# Patient Record
Sex: Male | Born: 1950 | Race: White | Hispanic: No | Marital: Married | Smoking: Never smoker
Health system: Southern US, Community
[De-identification: ages and names within clinical notes are randomized; demographics above are authoritative.]

## PROBLEM LIST (undated history)

## (undated) DIAGNOSIS — I10 Essential (primary) hypertension: Secondary | ICD-10-CM

---

## 2006-06-22 ENCOUNTER — Ambulatory Visit: Payer: Self-pay | Admitting: Unknown Physician Specialty

## 2007-03-01 ENCOUNTER — Ambulatory Visit: Payer: Self-pay | Admitting: Unknown Physician Specialty

## 2008-01-28 IMAGING — NM NUCLEAR MEDICINE HEPATOHBILIARY INCLUDE GB
1 series · 9 of 9 positions shown · non-contrast
Comparison: none

REASON FOR EXAM: RUQ pain
COMMENTS:

[Series 1000: gallbladder statics · 4.80mm/px · 9 of 9 slices shown]
[im 1/9]
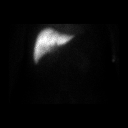
[im 2/9]
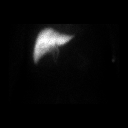
[im 3/9]
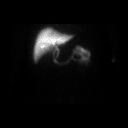
[im 4/9]
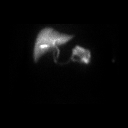
[im 5/9]
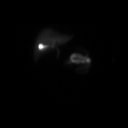
[im 6/9]
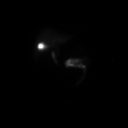
[im 7/9]
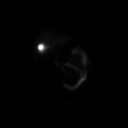
[im 8/9]
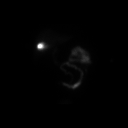
[im 9/9]
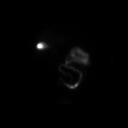

[9 of 9 positions shown; findings below may reference images not displayed]

PROCEDURE:     NM  - NM HEPATOBILIARY IMAGE  - June 22, 2006 [DATE]

RESULT:     Following intravenous administration 7.63 mCi of Technetium 99m
Choletec, there is observed prompt visualization of tracer activity in the
liver at 3 minutes. At 20 minutes, tracer activity is visualized in the
gallbladder, common duct and proximal small bowel.
IMPRESSION: 1.  Normal Hepatobiliary Scan.
2.  An ejection fraction determination was not performed in view of the
gallstones observed at ultrasound.

## 2008-01-28 IMAGING — US ABDOMEN ULTRASOUND
1 series · 17 of 25 positions shown · non-contrast
Comparison: none

REASON FOR EXAM: RUQ pain
COMMENTS:

[Series 1: abdomen ultrasound · 17 of 78 slices shown]
[im 1/78]
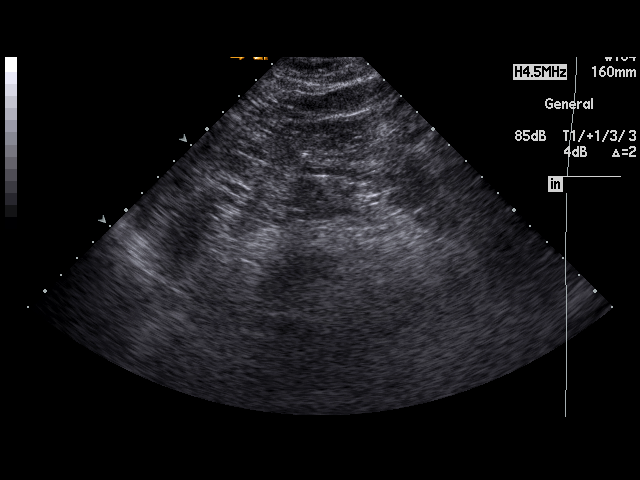
[im 7/78]
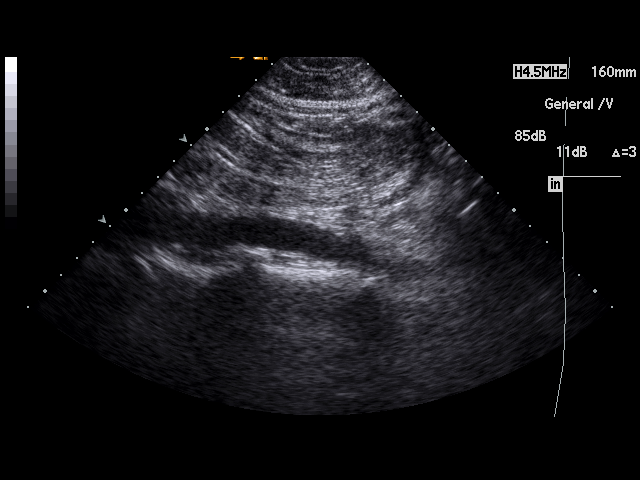
[im 10/78]
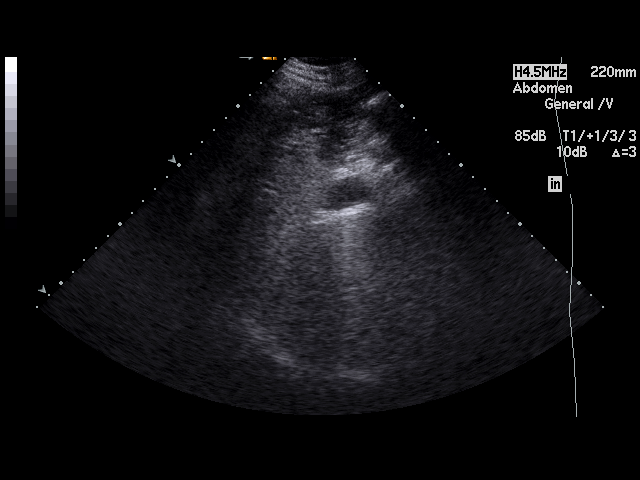
[im 17/78]
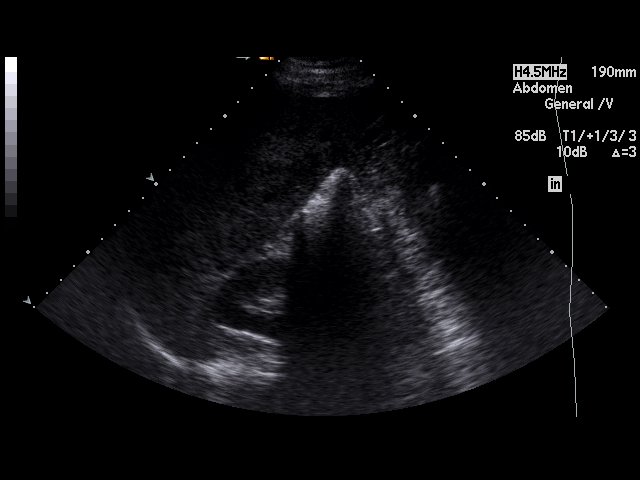
[im 20/78]
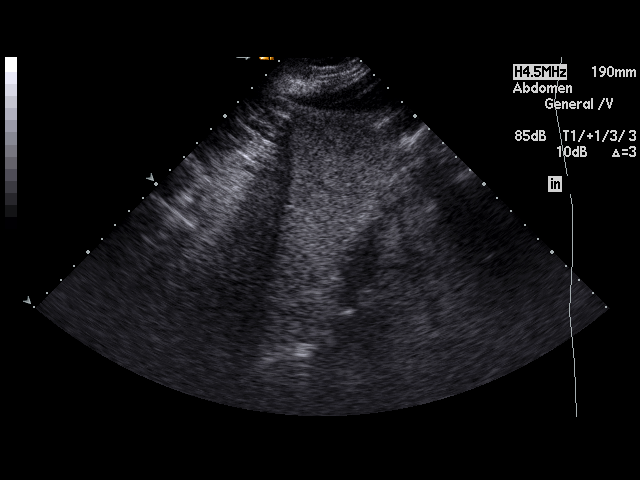
[im 26/78]
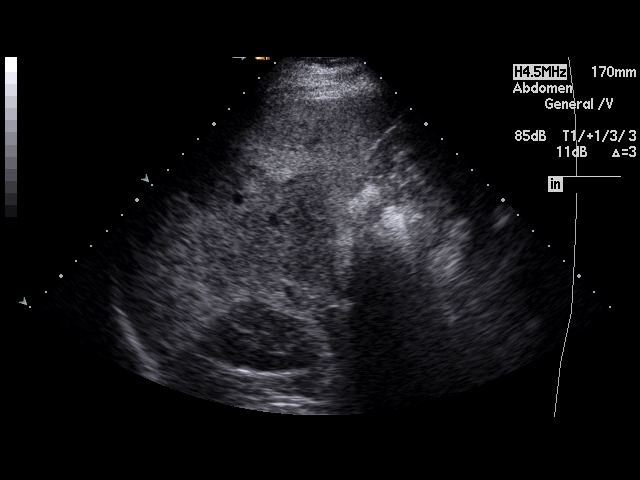
[im 29/78]
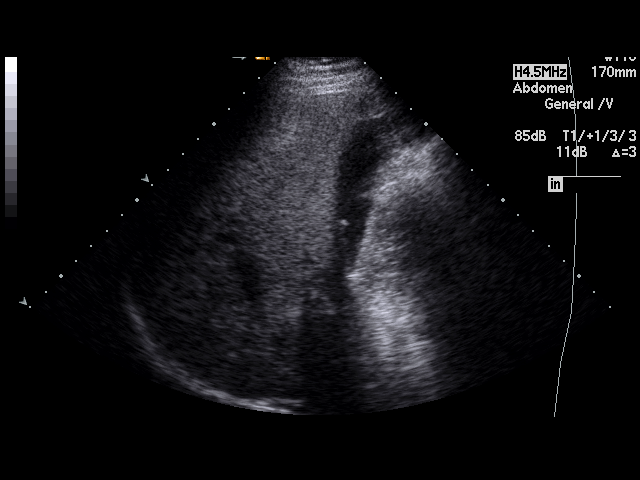
[im 36/78]
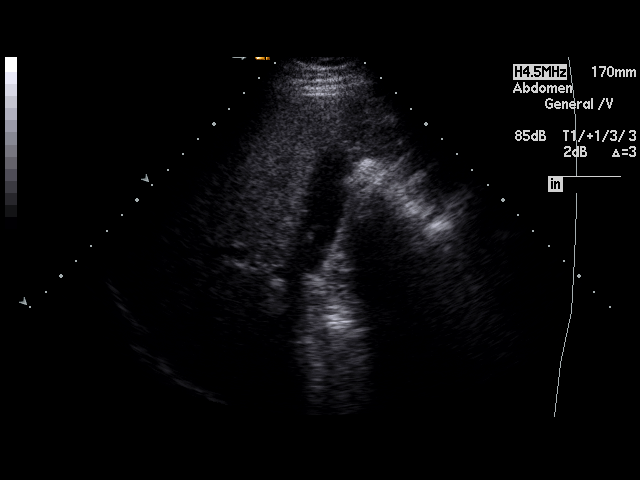
[im 39/78]
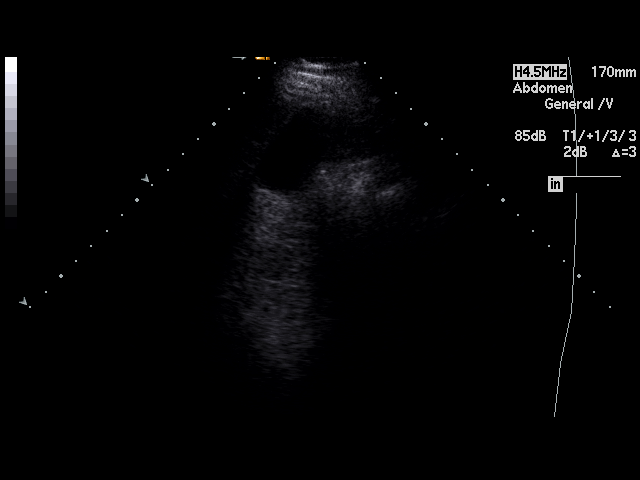
[im 42/78]
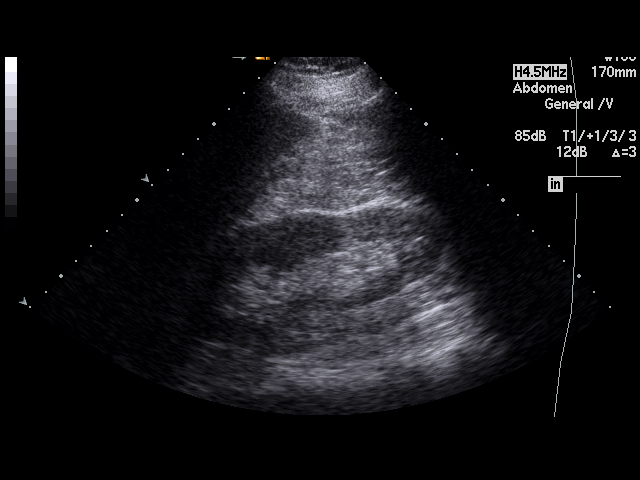
[im 49/78]
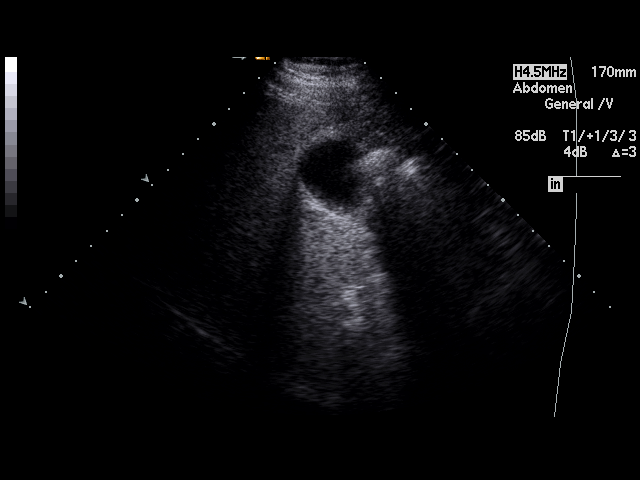
[im 52/78]
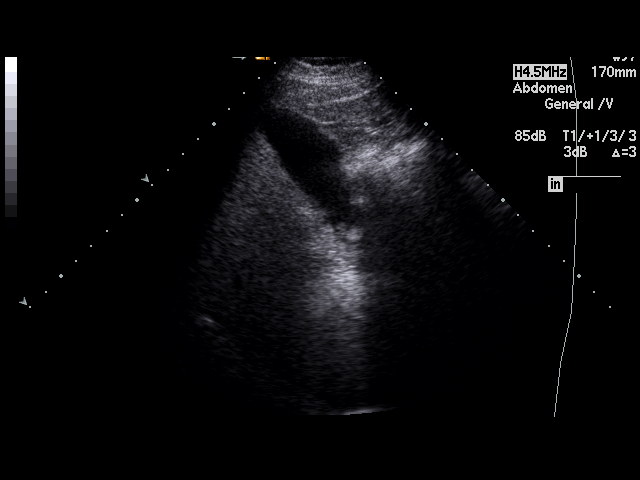
[im 58/78]
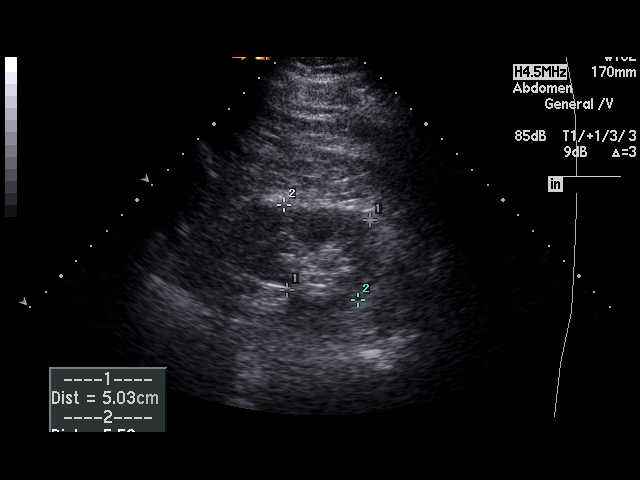
[im 61/78]
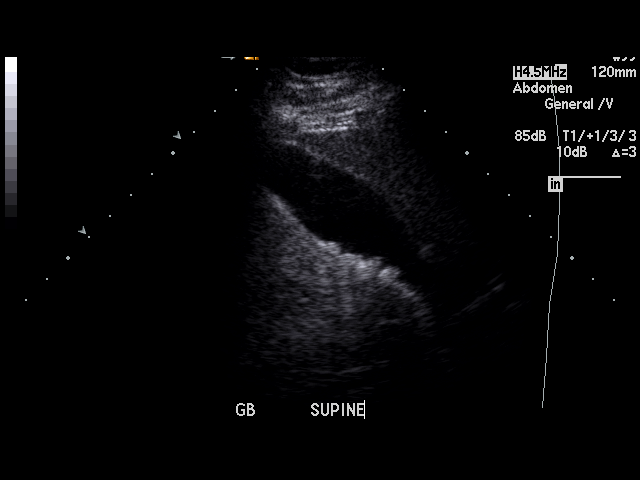
[im 68/78]
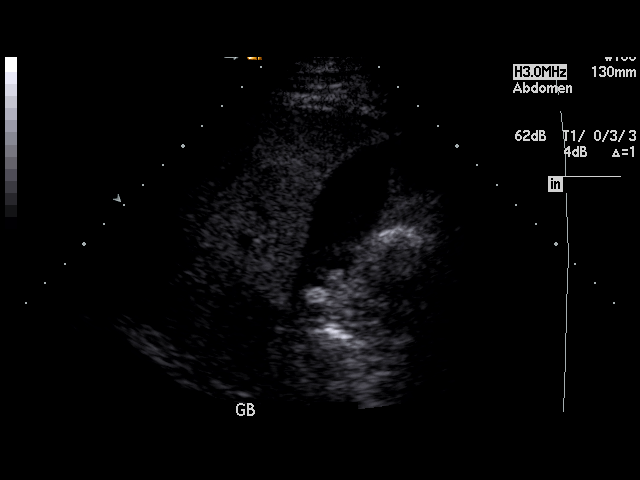
[im 71/78]
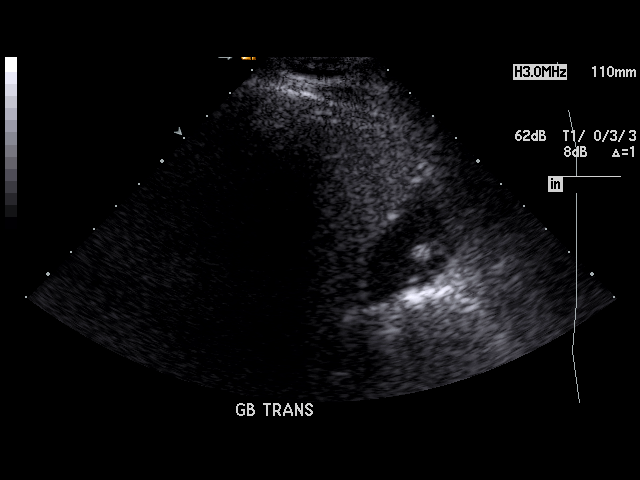
[im 78/78]
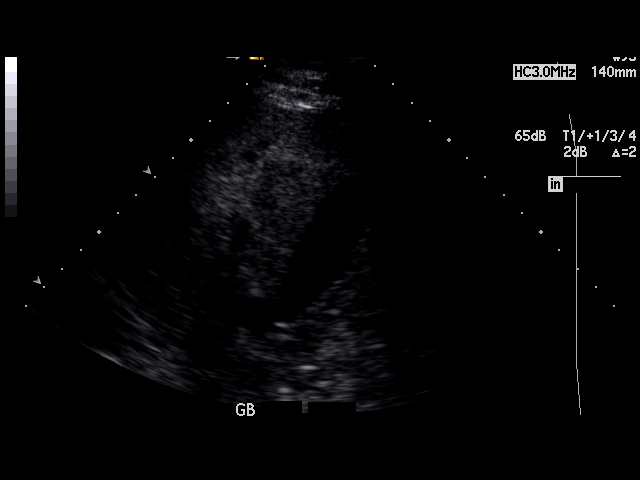

[17 of 25 positions shown; findings below may reference images not displayed]

PROCEDURE:     US  - US ABDOMEN GENERAL SURVEY  - June 22, 2006  [DATE]

RESULT:     The liver is not nonhomogeneous, suspicious for fatty
infiltration. The pancreas is not fully visualized but the visualized
portion is normal appearance.  The spleen size is normal.  The abdominal
aorta is normal in appearance. There are noted multiple echo densities in
the gallbladder. A few these are nonmobile and do not shadow and are thought
to represent gallbladder polyps. In addition, there are noted a few tiny
gallstones near the gallbladder neck. A small sludge is also present. There
is no thickening of the gallbladder wall.  The common bile duct measures
mm in diameter which is within normal limits. The kidneys show no
hydronephrosis. There is no ascites.
IMPRESSION: 1. Cholelithiasis.
2. There are and a few tiny nonshadowing echo densities in the gallbladder
suspicious for small polyps with the largest polyp measuring 5.8 mm at
maximum diameter.
2. Possible fatty infiltration of the liver.
3. The common bile duct is normal in size.

## 2018-01-09 DIAGNOSIS — C4491 Basal cell carcinoma of skin, unspecified: Secondary | ICD-10-CM

## 2018-01-09 HISTORY — DX: Basal cell carcinoma of skin, unspecified: C44.91

## 2019-10-22 ENCOUNTER — Ambulatory Visit: Payer: Self-pay | Admitting: Dermatology

## 2019-12-29 ENCOUNTER — Inpatient Hospital Stay
Admission: EM | Admit: 2019-12-29 | Discharge: 2020-01-07 | DRG: 177 | Disposition: A | Discharge status: Home Health | Payer: BC Managed Care – PPO | Attending: Internal Medicine | Admitting: Internal Medicine

## 2019-12-29 ENCOUNTER — Emergency Department: Payer: BC Managed Care – PPO

## 2019-12-29 ENCOUNTER — Other Ambulatory Visit: Payer: Self-pay

## 2019-12-29 DIAGNOSIS — Z85828 Personal history of other malignant neoplasm of skin: Secondary | ICD-10-CM | POA: Diagnosis not present

## 2019-12-29 DIAGNOSIS — E875 Hyperkalemia: Secondary | ICD-10-CM | POA: Diagnosis not present

## 2019-12-29 DIAGNOSIS — K5900 Constipation, unspecified: Secondary | ICD-10-CM | POA: Diagnosis not present

## 2019-12-29 DIAGNOSIS — Z7982 Long term (current) use of aspirin: Secondary | ICD-10-CM | POA: Diagnosis not present

## 2019-12-29 DIAGNOSIS — Z79899 Other long term (current) drug therapy: Secondary | ICD-10-CM

## 2019-12-29 DIAGNOSIS — I10 Essential (primary) hypertension: Secondary | ICD-10-CM | POA: Diagnosis present

## 2019-12-29 DIAGNOSIS — J9600 Acute respiratory failure, unspecified whether with hypoxia or hypercapnia: Secondary | ICD-10-CM | POA: Diagnosis not present

## 2019-12-29 DIAGNOSIS — J1282 Pneumonia due to coronavirus disease 2019: Secondary | ICD-10-CM | POA: Diagnosis present

## 2019-12-29 DIAGNOSIS — J9621 Acute and chronic respiratory failure with hypoxia: Secondary | ICD-10-CM

## 2019-12-29 DIAGNOSIS — Z283 Underimmunization status: Secondary | ICD-10-CM

## 2019-12-29 DIAGNOSIS — R7401 Elevation of levels of liver transaminase levels: Secondary | ICD-10-CM | POA: Diagnosis present

## 2019-12-29 DIAGNOSIS — E872 Acidosis: Secondary | ICD-10-CM | POA: Diagnosis present

## 2019-12-29 DIAGNOSIS — I2699 Other pulmonary embolism without acute cor pulmonale: Secondary | ICD-10-CM | POA: Diagnosis present

## 2019-12-29 DIAGNOSIS — U071 COVID-19: Principal | ICD-10-CM | POA: Diagnosis present

## 2019-12-29 DIAGNOSIS — A4189 Other specified sepsis: Secondary | ICD-10-CM | POA: Diagnosis present

## 2019-12-29 DIAGNOSIS — Z23 Encounter for immunization: Secondary | ICD-10-CM

## 2019-12-29 DIAGNOSIS — T380X5A Adverse effect of glucocorticoids and synthetic analogues, initial encounter: Secondary | ICD-10-CM | POA: Diagnosis not present

## 2019-12-29 DIAGNOSIS — E871 Hypo-osmolality and hyponatremia: Secondary | ICD-10-CM | POA: Diagnosis present

## 2019-12-29 DIAGNOSIS — I472 Ventricular tachycardia: Secondary | ICD-10-CM | POA: Diagnosis not present

## 2019-12-29 DIAGNOSIS — J9601 Acute respiratory failure with hypoxia: Secondary | ICD-10-CM

## 2019-12-29 DIAGNOSIS — R0902 Hypoxemia: Secondary | ICD-10-CM | POA: Diagnosis present

## 2019-12-29 DIAGNOSIS — R739 Hyperglycemia, unspecified: Secondary | ICD-10-CM | POA: Diagnosis not present

## 2019-12-29 DIAGNOSIS — R7301 Impaired fasting glucose: Secondary | ICD-10-CM | POA: Diagnosis present

## 2019-12-29 HISTORY — DX: Essential (primary) hypertension: I10

## 2019-12-29 LAB — FERRITIN: Ferritin: 2045 ng/mL — ABNORMAL HIGH (ref 24–336)

## 2019-12-29 LAB — CBC WITH DIFFERENTIAL/PLATELET
Abs Immature Granulocytes: 0.36 10*3/uL — ABNORMAL HIGH (ref 0.00–0.07)
Basophils Absolute: 0.1 10*3/uL (ref 0.0–0.1)
Basophils Relative: 0 %
Eosinophils Absolute: 0 10*3/uL (ref 0.0–0.5)
Eosinophils Relative: 0 %
HCT: 40.2 % (ref 39.0–52.0)
Hemoglobin: 14.7 g/dL (ref 13.0–17.0)
Immature Granulocytes: 2 %
Lymphocytes Relative: 2 %
Lymphs Abs: 0.4 10*3/uL — ABNORMAL LOW (ref 0.7–4.0)
MCH: 29.8 pg (ref 26.0–34.0)
MCHC: 36.6 g/dL — ABNORMAL HIGH (ref 30.0–36.0)
MCV: 81.5 fL (ref 80.0–100.0)
Monocytes Absolute: 0.4 10*3/uL (ref 0.1–1.0)
Monocytes Relative: 3 %
Neutro Abs: 14.7 10*3/uL — ABNORMAL HIGH (ref 1.7–7.7)
Neutrophils Relative %: 93 %
Platelets: 438 10*3/uL — ABNORMAL HIGH (ref 150–400)
RBC: 4.93 MIL/uL (ref 4.22–5.81)
RDW: 13.2 % (ref 11.5–15.5)
WBC: 15.9 10*3/uL — ABNORMAL HIGH (ref 4.0–10.5)
nRBC: 0 % (ref 0.0–0.2)

## 2019-12-29 LAB — COMPREHENSIVE METABOLIC PANEL
ALT: 102 U/L — ABNORMAL HIGH (ref 0–44)
AST: 80 U/L — ABNORMAL HIGH (ref 15–41)
Albumin: 2.8 g/dL — ABNORMAL LOW (ref 3.5–5.0)
Alkaline Phosphatase: 76 U/L (ref 38–126)
Anion gap: 15 (ref 5–15)
BUN: 25 mg/dL — ABNORMAL HIGH (ref 8–23)
CO2: 17 mmol/L — ABNORMAL LOW (ref 22–32)
Calcium: 8.1 mg/dL — ABNORMAL LOW (ref 8.9–10.3)
Chloride: 95 mmol/L — ABNORMAL LOW (ref 98–111)
Creatinine, Ser: 1.09 mg/dL (ref 0.61–1.24)
GFR calc Af Amer: >60 mL/min (ref >60)
GFR calc non Af Amer: >60 mL/min (ref >60)
Glucose, Bld: 182 mg/dL — ABNORMAL HIGH (ref 70–99)
Potassium: 4 mmol/L (ref 3.5–5.1)
Sodium: 127 mmol/L — ABNORMAL LOW (ref 135–145)
Total Bilirubin: 1 mg/dL (ref 0.3–1.2)
Total Protein: 6.6 g/dL (ref 6.5–8.1)

## 2019-12-29 LAB — BRAIN NATRIURETIC PEPTIDE: B Natriuretic Peptide: 75.5 pg/mL (ref 0.0–100.0)

## 2019-12-29 LAB — LACTATE DEHYDROGENASE: LDH: 536 U/L — ABNORMAL HIGH (ref 98–192)

## 2019-12-29 LAB — FIBRIN DERIVATIVES D-DIMER (ARMC ONLY): Fibrin derivatives D-dimer (ARMC): >7500 ng/mL (FEU) — ABNORMAL HIGH (ref 0.00–499.00)

## 2019-12-29 LAB — C-REACTIVE PROTEIN: CRP: 16.6 mg/dL — ABNORMAL HIGH (ref <1.0)

## 2019-12-29 LAB — SARS CORONAVIRUS 2 BY RT PCR (HOSPITAL ORDER, PERFORMED IN ~~LOC~~ HOSPITAL LAB): SARS Coronavirus 2: POSITIVE — AB

## 2019-12-29 LAB — TROPONIN I (HIGH SENSITIVITY): Troponin I (High Sensitivity): 16 ng/L (ref <18)

## 2019-12-29 LAB — LACTIC ACID, PLASMA: Lactic Acid, Venous: 3.6 mmol/L (ref 0.5–1.9)

## 2019-12-29 LAB — PROCALCITONIN: Procalcitonin: 0.77 ng/mL

## 2019-12-29 LAB — FIBRINOGEN: Fibrinogen: 648 mg/dL — ABNORMAL HIGH (ref 210–475)

## 2019-12-29 LAB — TRIGLYCERIDES: Triglycerides: 163 mg/dL — ABNORMAL HIGH (ref <150)

## 2019-12-29 MED ORDER — SODIUM CHLORIDE 0.9 % IV SOLN
1.0000 g | Freq: Once | INTRAVENOUS | Status: AC
  Administered 2019-12-29: 1 g via INTRAVENOUS
  Filled 2019-12-29: qty 10

## 2019-12-29 MED ORDER — HYDROCOD POLST-CPM POLST ER 10-8 MG/5ML PO SUER: 5.0000 mL | Freq: Two times a day (BID) | ORAL | Status: DC | PRN

## 2019-12-29 MED ORDER — SODIUM CHLORIDE 0.9 % IV SOLN
100.0000 mg | Freq: Every day | INTRAVENOUS | Status: AC
  Administered 2019-12-30 – 2020-01-02 (×4): 100 mg via INTRAVENOUS
  Filled 2019-12-29 (×5): qty 20

## 2019-12-29 MED ORDER — BARICITINIB 2 MG PO TABS
4.0000 mg | ORAL_TABLET | Freq: Every day | ORAL | Status: DC
  Administered 2019-12-29 – 2020-01-03 (×6): 4 mg via ORAL
  Filled 2019-12-29 (×6): qty 2

## 2019-12-29 MED ORDER — SODIUM CHLORIDE 0.9 % IV SOLN
200.0000 mg | Freq: Once | INTRAVENOUS | Status: AC
  Administered 2019-12-29: 200 mg via INTRAVENOUS
  Filled 2019-12-29: qty 200

## 2019-12-29 MED ORDER — ZINC SULFATE 220 (50 ZN) MG PO CAPS
220.0000 mg | ORAL_CAPSULE | Freq: Every day | ORAL | Status: DC
  Administered 2019-12-29 – 2020-01-07 (×10): 220 mg via ORAL
  Filled 2019-12-29 (×10): qty 1

## 2019-12-29 MED ORDER — DEXAMETHASONE SODIUM PHOSPHATE 10 MG/ML IJ SOLN
6.0000 mg | Freq: Once | INTRAMUSCULAR | Status: AC
  Administered 2019-12-29: 6 mg via INTRAVENOUS
  Filled 2019-12-29: qty 1

## 2019-12-29 MED ORDER — ENOXAPARIN SODIUM 40 MG/0.4ML ~~LOC~~ SOLN
40.0000 mg | SUBCUTANEOUS | Status: DC
  Administered 2019-12-29: 40 mg via SUBCUTANEOUS
  Filled 2019-12-29: qty 0.4

## 2019-12-29 MED ORDER — METHYLPREDNISOLONE SODIUM SUCC 125 MG IJ SOLR
1.0000 mg/kg | Freq: Two times a day (BID) | INTRAMUSCULAR | Status: AC
  Administered 2019-12-29 – 2020-01-01 (×6): 76.875 mg via INTRAVENOUS
  Filled 2019-12-29 (×6): qty 2

## 2019-12-29 MED ORDER — ACETAMINOPHEN 325 MG PO TABS: 650.0000 mg | ORAL_TABLET | Freq: Four times a day (QID) | ORAL | Status: DC | PRN

## 2019-12-29 MED ORDER — ASCORBIC ACID 500 MG PO TABS
1000.0000 mg | ORAL_TABLET | Freq: Every day | ORAL | Status: DC
  Administered 2019-12-29 – 2020-01-07 (×10): 1000 mg via ORAL
  Filled 2019-12-29 (×10): qty 2

## 2019-12-29 MED ORDER — PREDNISONE 50 MG PO TABS
50.0000 mg | ORAL_TABLET | Freq: Every day | ORAL | Status: DC
  Administered 2020-01-01 – 2020-01-07 (×7): 50 mg via ORAL
  Filled 2019-12-29 (×7): qty 1

## 2019-12-29 MED ORDER — GUAIFENESIN-DM 100-10 MG/5ML PO SYRP
10.0000 mL | ORAL_SOLUTION | ORAL | Status: DC | PRN
  Filled 2019-12-29: qty 10

## 2019-12-29 MED ORDER — LACTATED RINGERS IV BOLUS
1000.0000 mL | Freq: Once | INTRAVENOUS | Status: AC
  Administered 2019-12-29: 1000 mL via INTRAVENOUS

## 2019-12-29 MED ORDER — SODIUM CHLORIDE 0.9 % IV SOLN
500.0000 mg | Freq: Once | INTRAVENOUS | Status: AC
  Administered 2019-12-29: 500 mg via INTRAVENOUS
  Filled 2019-12-29: qty 500

## 2019-12-29 NOTE — ED Provider Notes (Signed)
-----------------------------------------   3:11 PM on 12/29/2019 -----------------------------------------  Blood pressure 131/74, pulse 99, temperature 98.3 F (36.8 C), temperature source Oral, resp. rate (!) 30, height 5\' 9"  (1.753 m), weight 77.1 kg, SpO2 91 %.  Assuming care from Dr. Cherylann Banas.  In short, Brett Maldonado is a 69 y.o. male with a chief complaint of Shortness of Breath .  Refer to the original H&P for additional details.  The current plan of care is to follow-up labs and imaging for suspected COVID with hypoxic respiratory failure.  ----------------------------------------- 4:44 PM on 12/29/2019 -----------------------------------------  On reevaluation, patient noted to have O2 sats consistently of 80 to 89% on bubble high flow.  I have asked respiratory to transition patient to heated high flow nasal cannula.  His work of breathing remained stable and he is overall well-appearing.  Lab work does have markers concerning for sepsis with leukocytosis, lactic acidosis, and elevated procalcitonin.  We will give bolus of IV fluids prior to recheck of lactate.  In discussion with hospitalist, we will cover for superimposed bacterial pneumonia with Rocephin and azithromycin for now.    Blake Divine, MD 12/29/19 [phone]

## 2019-12-29 NOTE — H&P (Signed)
History and Physical  Patient Name: Brett Maldonado     TDD:[id number]    DOB: 1950-08-24    DOA: 12/29/2019 PCP: System, Pcp Not In  Patient coming from: Home  Chief Complaint: Cough, dyspnea      HPI: Brett Maldonado is a 69 y.o. M with no significant PMHx who presents with 1 week cough and COVID exposure.  One week ago, the patient's wife developed flulike symptoms, for a few days, was in bed with aches and chills, but then recovered.  Shortly after, the patient started to develop dry cough, aches and malaise and fatigue, which progressed until yesterday and today he could barely breathe so he finally came to the emergency room.  In the ER, he was dyspneic, saturating 68% on room air.  Nasal swab was positive for COVID by rt-PCR, ferritin greater than 2000, chest x-ray showed bilateral opacities.  He was given dexamethasone and hospitalist service were asked to evaluate for COVID-19.          ROS: Review of Systems  Constitutional: Positive for chills, fever and malaise/fatigue.  HENT: Positive for congestion.   Respiratory: Positive for cough and shortness of breath. Negative for hemoptysis and sputum production.   Cardiovascular: Negative for chest pain, orthopnea, leg swelling and PND.  All other systems reviewed and are negative.         Past Medical History:  Diagnosis Date  . Basal cell carcinoma 01/09/2018   R ant to the any nasal alar crease nose  . Basal cell carcinoma 11/25/2018   L mid lat neck  . Hypertension     History reviewed. No pertinent surgical history.  Social History: Patient lives with his wife.  The patient walks unassisted.  Nonsmoker.  Works in [employer] consultant.  No Known Allergies  Family history: family history includes Dementia in his father.  Prior to Admission medications   Medication Sig Start Date End Date Taking? Authorizing Provider  ascorbic acid (VITAMIN C) 500 MG tablet Take 1,000 mg by mouth daily.   Yes [provider]   aspirin EC 81 MG tablet Take 324 mg by mouth daily.   Yes [provider]  Cholecalciferol (VITAMIN D-3) 125 MCG (5000 UT) TABS Take 5,000 Units by mouth daily.   Yes [provider]  Elderberry 575 MG/5ML SYRP Take 5 mLs by mouth as directed.   Yes [provider]  OVER THE COUNTER MEDICATION Use as directed 1 spray in the mouth or throat as directed. **Colloidal Silver**   Yes [provider]  zinc sulfate 220 (50 Zn) MG capsule Take 220 mg by mouth daily.   Yes [provider]       Physical Exam: BP 133/73   Pulse 95   Temp 98.3 F (36.8 C) (Oral)   Resp (!) 25   Ht 5\' 9"  (1.753 m)   Wt 77.1 kg   SpO2 96%   BMI 25.10 kg/m  General appearance: Well-developed, adult male, alert and in no acute distress.   Eyes: Anicteric, conjunctiva pink, lids and lashes normal. PERRL.    ENT: No nasal deformity, discharge, epistaxis.  Sounds congested.  Hearing normal. OP moist without lesions.   Skin: Warm and dry.  No jaundice.  No suspicious rashes or lesions. Cardiac: Tachycardic, regular, nl S1-S2, no murmurs appreciated.  Capillary refill is brisk.  JVP normal.  No LE edema.  Radial pulses 2+ and symmetric. Respiratory: Tachypneic, crackles in the right and left base, no wheezing. Abdomen:  Abdomen soft.  No TTP or guarding. No ascites, distension, hepatosplenomegaly.   MSK: No deformities or effusions of the large joints of the upper or lower extremities bilaterally.  No cyanosis or clubbing. Neuro: Cranial nerves 3 through 12 intact.  Sensation intact to light touch. Speech is fluent.  Muscle strength 5/5 and symmetric.    Psych: Sensorium intact and responding to questions, attention normal.  Behavior appropriate.  Affect normal.  Judgment and insight appear normal.     Labs on Admission:  I have personally reviewed following labs and imaging studies: CBC: Recent Labs  Lab 12/29/19 1419  WBC 15.9*  NEUTROABS 14.7*  HGB 14.7  HCT  40.2  MCV 81.5  PLT 297*   Basic Metabolic Panel: Recent Labs  Lab 12/29/19 1419  NA 127*  K 4.0  CL 95*  CO2 17*  GLUCOSE 182*  BUN 25*  CREATININE 1.09  CALCIUM 8.1*   GFR: Estimated Creatinine Clearance: 66.7 mL/min (by C-G formula based on SCr of 1.09 mg/dL).  Liver Function Tests: Recent Labs  Lab 12/29/19 1419  AST 80*  ALT 102*  ALKPHOS 76  BILITOT 1.0  PROT 6.6  ALBUMIN 2.8*   Lipid Profile: Recent Labs    12/29/19 1418  TRIG 163*   Anemia Panel: Recent Labs    12/29/19 1419  FERRITIN 2,045*   Sepsis Labs: Lactate 3.6  Recent Results (from the past 240 hour(s))  SARS Coronavirus 2 by RT PCR (hospital order, performed in [facility] hospital lab) Nasopharyngeal Nasopharyngeal Swab     Status: Abnormal   Collection Time: 12/29/19  2:20 PM   Specimen: Nasopharyngeal Swab  Result Value Ref Range Status   SARS Coronavirus 2 POSITIVE (A) NEGATIVE Final    Comment: RESULT CALLED TO, READ BACK BY AND VERIFIED WITH: KATIE FERGUSSON @1606  12/29/19 MJU (NOTE) SARS-CoV-2 target nucleic acids are DETECTED  SARS-CoV-2 RNA is generally detectable in upper respiratory specimens  during the acute phase of infection.  Positive results are indicative  of the presence of the identified virus, but do not rule out bacterial infection or co-infection with other pathogens not detected by the test.  Clinical correlation with patient history and  other diagnostic information is necessary to determine patient infection status.  The expected result is negative.  Fact Sheet for Patients:   StrictlyIdeas.no   Fact Sheet for Healthcare Providers:   BankingDealers.co.za    This test is not yet approved or cleared by the [place] FDA and  has been authorized for detection and/or diagnosis of SARS-CoV-2 by FDA under an Emergency Use Authorization (EUA).  This EUA will remain in effect (meaning this test  can be used) for  the duration of  the COVID-19 declaration under Section 564(b)(1) of the Act, 21 U.S.C. section 360-bbb-3(b)(1), unless the authorization is terminated or revoked sooner.  Performed at [facility], [address], [place], [place] [address]            Radiological Exams on Admission: Personally reviewed chest x-ray shows bilateral opacities: DG Chest Port 1 View  Result Date: 12/29/2019 CLINICAL DATA:  Fever, shortness of breath. EXAM: PORTABLE CHEST 1 VIEW COMPARISON:  None. FINDINGS: The heart size and mediastinal contours are within normal limits. No pneumothorax or pleural effusion is noted. Multiple airspace opacities are noted throughout both lungs concerning for multifocal pneumonia, potentially due to COVID-19. The visualized skeletal structures are unremarkable. IMPRESSION: Multiple bilateral airspace opacities are noted concerning for multifocal pneumonia, potentially due to  COVID-19. Electronically Signed   By: Marijo Conception M.D.   On: 12/29/2019 14:57    EKG: Independently reviewed.  Sinus tachycardia, rate 114.  No ST changes.       Assessment/Plan   Acute hypoxic respiratory failure due to COVID-19 pneumonia Covid PCR positive on 9/6.  Symptoms started around 9/1.  He presented with dyspnea and SPO2 60% setting of bilateral pneumonia from SARS-CoV-2.  -Start remdesivir, day 1-5 -Start Solu-Medrol, 1 mg/kg div BID  -Start barcitinib -Lovenox for DVT prophylaxis -Encourage mobility, I-S, flutter, and proning -Airborne precautions -Trend LFTs, dimer CRP, ferritin       Hyponatremia Mild, asymptomatic  Hypertension This is a chart history from [facility] PCP notes where he was noted to have BP 148/90 at last visit.  He is not on antihypertensives and is not aware of this diagnosis.  Metabolic acidosis Mild, maybe due to lactic acid.  Non-gap, maybe due to diarrhea. -Trend BMP  Transaminitis -Trend LFTs  Leukocytosis Lactic  acidosis I suspect this is a stress response.  Despite the lactate greater than 2, this is much more likely due to acute hypoxic respiratory failure and sepsis, which I believe is not present.          Based on ACTT-2, COV-BARRIER and other available local data, IL-6 inhibition with barcitinib is recommended for this patient with severe and worsening respiratory failure from COVID-19.  He has no end-stage renal disease, no kidney injury, no history of TB, no neutropenia or lymphopenia, and his LFT elevations are very mild.  He has not on antirheumatic treatment or probenecid and is not pregnant.  The option to use or refuse present treatment under FDA authorization (not approval), significant noted potential risks and benefits, and the extent to which these are unknown was discussed with the patient including risk of secondary infection or VTE.  Agrees to treatment.     DVT prophylaxis: Lovenox  Code Status: FULL  Family Communication: Wife by phone  Disposition Plan: Anticipate initiate remdesivir, barcitinib, and steroids.   Consults called: None Admission status: INPATIENT  At the time of admission, it appears that the appropriate admission status for this patient is INPATIENT. This is judged to be reasonable and necessary in order to provide the required intensity of service to ensure the patient's safety given: -presenting symptoms of dyspnea -physical exam findings of tachypnea, and hypoxia, and   -initial radiographic and laboratory data bilateral pneumonia, COVID+, transaminitis, hyponatremia, acidosis     Together, these circumstances are felt to place him at high risk for further clinical deterioration threatening life, limb, or organ requiring a high intensity of service due to this acute illness that poses a threat to life, limb or bodily function.  I certify that at the point of admission it is my clinical judgment that the patient will require inpatient hospital care  spanning beyond 2 midnights from the point of admission and that early discharge would result in unnecessary risk of decompensation and readmission or threat to life, limb or bodily function.    Medical decision making: Patient seen at 5:42 PM on 12/29/2019.  The patient was discussed with Dr. Charna Archer.  What exists of the patient's chart was reviewed in depth and summarized above.  Clinical condition: Requiring 15 L to keep oxygen saturation 90%, however mentation good, blood pressure normal.        Suann Larry Athina Fahey [facility] Please page though [place] or Epic secure chat:  For password, contact charge nurse

## 2019-12-29 NOTE — ED Provider Notes (Signed)
[facility] Emergency Department Provider Note ____________________________________________   First MD Initiated Contact with Patient 12/29/19 1419     (approximate)  I have reviewed the triage vital signs and the nursing notes.   HISTORY  Chief Complaint Shortness of Breath    HPI Brett Maldonado is a 69 y.o. male with no active medical problems who presents with shortness of breath over the last 3 days, gradual onset, persistent course, and present regardless of whether he is exerting himself.  He reports associated generalized weakness and some fevers last week.  He denies any associated vomiting or diarrhea, and has no pain.  He has associated loss of taste and smell.  He took an old prednisone pill for the last 3 days with mild improvement in his symptoms.  Per EMS, O2 saturation was 68% on room air when they arrived.  The patient is not vaccinated for COVID-19.  Past Medical History:  Diagnosis Date  . Basal cell carcinoma 01/09/2018   R ant to the any nasal alar crease nose  . Basal cell carcinoma 11/25/2018   L mid lat neck    There are no problems to display for this patient.   History reviewed. No pertinent surgical history.  Prior to Admission medications   Not on File    Allergies Patient has no allergy information on record.  History reviewed. No pertinent family history.  Social History Social History   Tobacco Use  . Smoking status: Never Smoker  . Smokeless tobacco: Never Used  Substance Use Topics  . Alcohol use: Never  . Drug use: Never    Review of Systems  Constitutional: Negative for fever. Eyes: No redness. ENT: No sore throat. Cardiovascular: Denies chest pain. Respiratory: Positive for shortness of breath. Gastrointestinal: No vomiting. Genitourinary: Negative for flank pain. Musculoskeletal: Negative for back pain. Skin: Negative for rash. Neurological: Negative for  headache.   ____________________________________________   PHYSICAL EXAM:  VITAL SIGNS: ED Triage Vitals  Enc Vitals Group     BP 12/29/19 1418 (!) 151/62     Pulse Rate 12/29/19 1418 (!) 117     Resp 12/29/19 1418 18     Temp 12/29/19 1418 98.3 F (36.8 C)     Temp Source 12/29/19 1418 Oral     SpO2 12/29/19 1418 90 %     Weight 12/29/19 1417 170 lb (77.1 kg)     Height 12/29/19 1417 5\' 9"  (1.753 m)     Head Circumference --      Peak Flow --      Pain Score 12/29/19 1417 0     Pain Loc --      Pain Edu? --      Excl. in [place]? --     Constitutional: Alert and oriented.  Uncomfortable appearing but in no acute distress. Eyes: Conjunctivae are normal.  Head: Atraumatic. Nose: No congestion/rhinnorhea. Mouth/Throat: Mucous membranes are moist.   Neck: Normal range of motion.  Cardiovascular: Tachycardic, regular rhythm.  Good peripheral circulation. Respiratory: Increased respiratory effort with some accessory muscle use.  Speaking in full sentences. Gastrointestinal: No distention.  Musculoskeletal: Extremities warm and well perfused.  Neurologic:  Normal speech and language. No gross focal neurologic deficits are appreciated.  Skin:  Skin is warm and dry. No rash noted. Psychiatric: Mood and affect are normal. Speech and behavior are normal.  ____________________________________________   LABS (all labs ordered are listed, but only abnormal results are displayed)  Labs Reviewed  CBC WITH  DIFFERENTIAL/PLATELET - Abnormal; Notable for the following components:      Result Value   WBC 15.9 (*)    MCHC 36.6 (*)    Platelets 438 (*)    Neutro Abs 14.7 (*)    Lymphs Abs 0.4 (*)    Abs Immature Granulocytes 0.36 (*)    All other components within normal limits  FIBRINOGEN - Abnormal; Notable for the following components:   Fibrinogen 648 (*)    All other components within normal limits  SARS CORONAVIRUS 2 BY RT PCR (HOSPITAL ORDER, [place]  LAB)  CULTURE, BLOOD (ROUTINE X 2)  CULTURE, BLOOD (ROUTINE X 2)  LACTIC ACID, PLASMA  LACTIC ACID, PLASMA  COMPREHENSIVE METABOLIC PANEL  FIBRIN DERIVATIVES D-DIMER ([facility] ONLY)  PROCALCITONIN  LACTATE DEHYDROGENASE  FERRITIN  TRIGLYCERIDES  C-REACTIVE PROTEIN  BRAIN NATRIURETIC PEPTIDE  TROPONIN I (HIGH SENSITIVITY)   ____________________________________________  EKG  ED ECG REPORT I, Arta Silence, the attending physician, personally viewed and interpreted this ECG.  Date: 12/29/2019 EKG Time: 1418 Rate: 118 Rhythm: normal sinus rhythm QRS Axis: normal Intervals: normal ST/T Wave abnormalities: normal Narrative Interpretation: no evidence of acute ischemia  ____________________________________________  RADIOLOGY  CXR: Bilateral infiltrates consistent with multifocal pneumonia, COVID-19  ____________________________________________   PROCEDURES  Procedure(s) performed: No  Procedures  Critical Care performed: Yes  CRITICAL CARE Performed by: Arta Silence   Total critical care time: 30 minutes  Critical care time was exclusive of separately billable procedures and treating other patients.  Critical care was necessary to treat or prevent imminent or life-threatening deterioration.  Critical care was time spent personally by me on the following activities: development of treatment plan with patient and/or surrogate as well as nursing, discussions with consultants, evaluation of patient's response to treatment, examination of patient, obtaining history from patient or surrogate, ordering and performing treatments and interventions, ordering and review of laboratory studies, ordering and review of radiographic studies, pulse oximetry and re-evaluation of patient's condition. ____________________________________________   INITIAL IMPRESSION / ASSESSMENT AND PLAN / ED COURSE  Pertinent labs & imaging results that were available during my care of  the patient were reviewed by me and considered in my medical decision making (see chart for details).  69 year old male with no active medical problems presents with worsening shortness of breath over the last 3 days associated with generalized malaise, weakness, and some fevers over the last 6 days.  He has no known exposures to Covid, but his wife is also having some symptoms.  He is not vaccinated.  I reviewed the past medical records in Epic; the patient has no prior ED visits or admissions here.  On exam, the patient is uncomfortable appearing with increased work of breathing but no acute respiratory distress.  O2 saturation is around 90% on 15 L by nonrebreather.  Exam is otherwise as described above.  Overall presentation is most consistent with COVID-19.  Differential would also include less likely acute bacterial pneumonia, acute bronchitis, other viral etiology, new onset CHF.  We will attempt to place the patient on high flow nasal cannula, and I have ordered a Covid swab, chest x-ray, and relevant lab work-up.  If Covid is confirmed or there are strong suspicious x-ray findings I will start remdesivir and dexamethasone.  Plan will be for admission.  ----------------------------------------- 3:16 PM on 12/29/2019 -----------------------------------------  The patient is doing well on high flow nasal cannula with an O2 saturation in the low 90s. Chest x-ray shows findings compatible with  COVID-19. I have signed the patient out to the oncoming physician Dr. Charna Archer. ____________________________________________   FINAL CLINICAL IMPRESSION(S) / ED DIAGNOSES  Final diagnoses:  Acute respiratory failure with hypoxia ([place])      NEW MEDICATIONS STARTED DURING THIS VISIT:  New Prescriptions   No medications on file     Note:  This document was prepared using Dragon voice recognition software and may include unintentional dictation errors.    Arta Silence, MD 12/29/19  [phone]

## 2019-12-29 NOTE — ED Notes (Signed)
Date and time results received: 12/29/19 1612  Test: Lactic acid  Critical Value: 3.6  Orders Received? Or Actions Taken?: See order for LR bolus.  Test: Covid  Critical Value: positive   Name of Provider Notified: Dr Charna Archer

## 2019-12-29 NOTE — ED Notes (Signed)
RT at bedside to place patient on [address] [place].

## 2019-12-29 NOTE — ED Notes (Signed)
Repeat lactic sent to lab

## 2019-12-29 NOTE — ED Triage Notes (Addendum)
Pt here via [facility] from home.   Approx 1 week ago pt and wife started experiencing covid symptoms- fevers/ lost of taste and smell.   For the past 48hours patient has had increasing shortness of breath. Pt tried an old prescription of prednisone x3 days with no improvement in symptoms.  On EMS arrival RA sats were 68%. Pt now on 15L NRB satting at 89%.  EMS VS- temp 98.7 oral, bp 172/80, etco2 18, hr 117.

## 2019-12-29 NOTE — Progress Notes (Signed)
Remdesivir - Pharmacy Brief Note   O:  ALT: 102 CXR: Multiple bilateral airspace opacities are noted concerning for multifocal pneumonia, potentially due to COVID-19. SpO2: 99% on 15L HFNC   A/P:  Remdesivir 200 mg IVPB once followed by 100 mg IVPB daily x 4 days.   Dorena Bodo, PharmD 12/29/2019 5:49 PM

## 2019-12-30 ENCOUNTER — Inpatient Hospital Stay: Payer: BC Managed Care – PPO

## 2019-12-30 ENCOUNTER — Encounter: Payer: Self-pay | Admitting: Family Medicine

## 2019-12-30 DIAGNOSIS — J9600 Acute respiratory failure, unspecified whether with hypoxia or hypercapnia: Secondary | ICD-10-CM

## 2019-12-30 DIAGNOSIS — J1282 Pneumonia due to coronavirus disease 2019: Secondary | ICD-10-CM

## 2019-12-30 DIAGNOSIS — E871 Hypo-osmolality and hyponatremia: Secondary | ICD-10-CM

## 2019-12-30 DIAGNOSIS — I2699 Other pulmonary embolism without acute cor pulmonale: Secondary | ICD-10-CM

## 2019-12-30 DIAGNOSIS — R7301 Impaired fasting glucose: Secondary | ICD-10-CM

## 2019-12-30 LAB — COMPREHENSIVE METABOLIC PANEL
ALT: 76 U/L — ABNORMAL HIGH (ref 0–44)
AST: 41 U/L (ref 15–41)
Albumin: 2.4 g/dL — ABNORMAL LOW (ref 3.5–5.0)
Alkaline Phosphatase: 62 U/L (ref 38–126)
Anion gap: 9 (ref 5–15)
BUN: 25 mg/dL — ABNORMAL HIGH (ref 8–23)
CO2: 25 mmol/L (ref 22–32)
Calcium: 8 mg/dL — ABNORMAL LOW (ref 8.9–10.3)
Chloride: 100 mmol/L (ref 98–111)
Creatinine, Ser: 0.97 mg/dL (ref 0.61–1.24)
GFR calc Af Amer: >60 mL/min (ref >60)
GFR calc non Af Amer: >60 mL/min (ref >60)
Glucose, Bld: 149 mg/dL — ABNORMAL HIGH (ref 70–99)
Potassium: 4.7 mmol/L (ref 3.5–5.1)
Sodium: 134 mmol/L — ABNORMAL LOW (ref 135–145)
Total Bilirubin: 0.8 mg/dL (ref 0.3–1.2)
Total Protein: 6 g/dL — ABNORMAL LOW (ref 6.5–8.1)

## 2019-12-30 LAB — CBC WITH DIFFERENTIAL/PLATELET
Abs Immature Granulocytes: 0.14 10*3/uL — ABNORMAL HIGH (ref 0.00–0.07)
Basophils Absolute: 0 10*3/uL (ref 0.0–0.1)
Basophils Relative: 0 %
Eosinophils Absolute: 0 10*3/uL (ref 0.0–0.5)
Eosinophils Relative: 0 %
HCT: 38 % — ABNORMAL LOW (ref 39.0–52.0)
Hemoglobin: 13.7 g/dL (ref 13.0–17.0)
Immature Granulocytes: 2 %
Lymphocytes Relative: 7 %
Lymphs Abs: 0.6 10*3/uL — ABNORMAL LOW (ref 0.7–4.0)
MCH: 29.9 pg (ref 26.0–34.0)
MCHC: 36.1 g/dL — ABNORMAL HIGH (ref 30.0–36.0)
MCV: 83 fL (ref 80.0–100.0)
Monocytes Absolute: 0.3 10*3/uL (ref 0.1–1.0)
Monocytes Relative: 3 %
Neutro Abs: 7.7 10*3/uL (ref 1.7–7.7)
Neutrophils Relative %: 88 %
Platelets: 276 10*3/uL (ref 150–400)
RBC: 4.58 MIL/uL (ref 4.22–5.81)
RDW: 13.5 % (ref 11.5–15.5)
WBC: 8.7 10*3/uL (ref 4.0–10.5)
nRBC: 0 % (ref 0.0–0.2)

## 2019-12-30 LAB — HEMOGLOBIN A1C
Hgb A1c MFr Bld: 6.1 % — ABNORMAL HIGH (ref 4.8–5.6)
Mean Plasma Glucose: 128.37 mg/dL

## 2019-12-30 LAB — FIBRIN DERIVATIVES D-DIMER (ARMC ONLY): Fibrin derivatives D-dimer (ARMC): >7500 ng/mL (FEU) — ABNORMAL HIGH (ref 0.00–499.00)

## 2019-12-30 LAB — HIV ANTIBODY (ROUTINE TESTING W REFLEX): HIV Screen 4th Generation wRfx: NONREACTIVE

## 2019-12-30 LAB — FERRITIN: Ferritin: 1327 ng/mL — ABNORMAL HIGH (ref 24–336)

## 2019-12-30 LAB — C-REACTIVE PROTEIN: CRP: 20.8 mg/dL — ABNORMAL HIGH (ref <1.0)

## 2019-12-30 MED ORDER — ENOXAPARIN SODIUM 80 MG/0.8ML ~~LOC~~ SOLN
1.0000 mg/kg | Freq: Two times a day (BID) | SUBCUTANEOUS | Status: DC
  Administered 2019-12-30 – 2019-12-31 (×3): 77.5 mg via SUBCUTANEOUS
  Filled 2019-12-30 (×4): qty 0.8

## 2019-12-30 MED ORDER — PNEUMOCOCCAL VAC POLYVALENT 25 MCG/0.5ML IJ INJ: 0.5000 mL | INJECTION | INTRAMUSCULAR | Status: DC

## 2019-12-30 MED ORDER — IOHEXOL 350 MG/ML SOLN
75.0000 mL | Freq: Once | INTRAVENOUS | Status: AC | PRN
  Administered 2019-12-30: 75 mL via INTRAVENOUS

## 2019-12-30 MED ORDER — IPRATROPIUM-ALBUTEROL 20-100 MCG/ACT IN AERS
2.0000 | INHALATION_SPRAY | Freq: Four times a day (QID) | RESPIRATORY_TRACT | Status: DC
  Administered 2019-12-30 – 2020-01-07 (×34): 2 via RESPIRATORY_TRACT
  Filled 2019-12-30 (×2): qty 4

## 2019-12-30 NOTE — ED Notes (Signed)
HFNC at 15L + Non-rebreather mask

## 2019-12-30 NOTE — ED Notes (Signed)
Pt is on HFNC along with nonrebreather

## 2019-12-30 NOTE — Progress Notes (Signed)
Patient ID: Brett Maldonado, male   DOB: 1950-12-03, 69 y.o.   MRN: [id number] [facility] PROGRESS NOTE  Brett Maldonado HWT:[id number] DOB: 1951/01/07 DOA: 12/29/2019 PCP: System, Pcp Not In  HPI/Subjective: Patient states he is breathing better after being placed on oxygen.  Came in with shortness of breath and found to have Covid infection and pneumonia.  No fever or chills.  No nausea vomiting or diarrhea.  Objective: Vitals:   12/30/19 0937 12/30/19 1111  BP:  128/72  Pulse:  85  Resp:    Temp:    SpO2: 94% 93%    Intake/Output Summary (Last 24 hours) at 12/30/2019 1321 Last data filed at 12/30/2019 1117 Gross per 24 hour  Intake 1400 ml  Output 575 ml  Net 825 ml   Filed Weights   12/29/19 1417  Weight: 77.1 kg    ROS: Review of Systems  Respiratory: Positive for cough and shortness of breath.   Cardiovascular: Negative for palpitations.  Gastrointestinal: Negative for abdominal pain, nausea and vomiting.   Exam: Physical Exam HENT:     Nose: No mucosal edema.     Mouth/Throat:     Pharynx: No oropharyngeal exudate.  Eyes:     General: Lids are normal.     Conjunctiva/sclera: Conjunctivae normal.     Pupils: Pupils are equal, round, and reactive to light.  Cardiovascular:     Rate and Rhythm: Normal rate and regular rhythm.     Heart sounds: Normal heart sounds, S1 normal and S2 normal.  Pulmonary:     Breath sounds: Examination of the right-middle field reveals decreased breath sounds and wheezing. Examination of the left-middle field reveals decreased breath sounds and wheezing. Examination of the right-lower field reveals decreased breath sounds and rhonchi. Examination of the left-lower field reveals decreased breath sounds and rhonchi. Decreased breath sounds, wheezing and rhonchi present.  Abdominal:     Palpations: Abdomen is soft.     Tenderness: There is no abdominal tenderness.  Musculoskeletal:     Right ankle: No swelling.     Left ankle: No  swelling.  Skin:    General: Skin is warm.     Findings: No rash.  Neurological:     Mental Status: He is alert and oriented to person, place, and time.       Data Reviewed: Basic Metabolic Panel: Recent Labs  Lab 12/29/19 1419 12/30/19 0624  NA 127* 134*  K 4.0 4.7  CL 95* 100  CO2 17* 25  GLUCOSE 182* 149*  BUN 25* 25*  CREATININE 1.09 0.97  CALCIUM 8.1* 8.0*   Liver Function Tests: Recent Labs  Lab 12/29/19 1419 12/30/19 0624  AST 80* 41  ALT 102* 76*  ALKPHOS 76 62  BILITOT 1.0 0.8  PROT 6.6 6.0*  ALBUMIN 2.8* 2.4*   CBC: Recent Labs  Lab 12/29/19 1419 12/30/19 0624  WBC 15.9* 8.7  NEUTROABS 14.7* 7.7  HGB 14.7 13.7  HCT 40.2 38.0*  MCV 81.5 83.0  PLT 438* 276   BNP (last 3 results) Recent Labs    12/29/19 1418  BNP 75.5      Recent Results (from the past 240 hour(s))  Blood Culture (routine x 2)     Status: None (Preliminary result)   Collection Time: 12/29/19  2:19 PM   Specimen: BLOOD  Result Value Ref Range Status   Specimen Description BLOOD LEFT ANTECUBITAL  Final   Special Requests   Final    BOTTLES DRAWN AEROBIC  AND ANAEROBIC Blood Culture adequate volume   Culture   Final    NO GROWTH < 24 HOURS Performed at [facility], [address] [place], [place] [address]    Report Status PENDING  Incomplete  Blood Culture (routine x 2)     Status: None (Preliminary result)   Collection Time: 12/29/19  2:19 PM   Specimen: BLOOD  Result Value Ref Range Status   Specimen Description BLOOD RIGHT ANTECUBITAL  Final   Special Requests   Final    BOTTLES DRAWN AEROBIC AND ANAEROBIC Blood Culture adequate volume   Culture   Final    NO GROWTH < 24 HOURS Performed at [facility], [address], [place], [place] [address]    Report Status PENDING  Incomplete  SARS Coronavirus 2 by RT PCR (hospital order, performed in [place] hospital lab) Nasopharyngeal Nasopharyngeal Swab     Status: Abnormal   Collection  Time: 12/29/19  2:20 PM   Specimen: Nasopharyngeal Swab  Result Value Ref Range Status   SARS Coronavirus 2 POSITIVE (A) NEGATIVE Final    Comment: RESULT CALLED TO, READ BACK BY AND VERIFIED WITH: KATIE FERGUSSON @1606  12/29/19 MJU (NOTE) SARS-CoV-2 target nucleic acids are DETECTED  SARS-CoV-2 RNA is generally detectable in upper respiratory specimens  during the acute phase of infection.  Positive results are indicative  of the presence of the identified virus, but do not rule out bacterial infection or co-infection with other pathogens not detected by the test.  Clinical correlation with patient history and  other diagnostic information is necessary to determine patient infection status.  The expected result is negative.  Fact Sheet for Patients:   StrictlyIdeas.no   Fact Sheet for Healthcare Providers:   BankingDealers.co.za    This test is not yet approved or cleared by the [place] FDA and  has been authorized for detection and/or diagnosis of SARS-CoV-2 by FDA under an Emergency Use Authorization (EUA).  This EUA will remain in effect (meaning this test  can be used) for the duration of  the COVID-19 declaration under Section 564(b)(1) of the Act, 21 U.S.C. section 360-bbb-3(b)(1), unless the authorization is terminated or revoked sooner.  Performed at [facility], [address], [place], [place] [address]      Studies: CT ANGIO CHEST PE W OR WO CONTRAST  Result Date: 12/30/2019 CLINICAL DATA:  Suspected PE.  Positive D-dimer. EXAM: CT ANGIOGRAPHY CHEST WITH CONTRAST TECHNIQUE: Multidetector CT imaging of the chest was performed using the standard protocol during bolus administration of intravenous contrast. Multiplanar CT image reconstructions and MIPs were obtained to evaluate the vascular anatomy. CONTRAST:  35mL OMNIPAQUE IOHEXOL 350 MG/ML SOLN COMPARISON:  Chest x-ray December 29, 2019 FINDINGS:  Cardiovascular: Signs of subsegmental emboli in the lower lobes. Example seen on image 179 of series 5 in the LEFT lower lobe medially. Small subsegmental embolus suspected also on image 227 of series 5 in the RIGHT lower lobe. Segmental embolus at a branch point in the RIGHT lower lobe on image 135 of series 5 and in the RIGHT upper lobe on image 126 of series 5. Scattered small emboli throughout the chest also in the LEFT upper lobe on image 86 of series 5 posteriorly as an additional example of embolic disease. Small embolus in the lingular segmental branch on image 41 of series 7 and in in the anterior LEFT upper lobe branch on image 128 of series 5. No pericardial effusion. RV to LV ratio approximately  0.8 with mild straightening of the interventricular septum. Aorta with scattered atherosclerosis.  No aneurysmal dilation. Mediastinum/Nodes: Borderline enlarged lymph nodes scattered throughout the chest, most likely reactive given parenchymal findings. No axillary lymphadenopathy. No thoracic inlet adenopathy. Lungs/Pleura: Diffuse ground-glass and septal thickening worse at the lung bases. Background nodularity is present within the pattern, for instance on image 46 of series 6 in the LEFT upper lobe a 1.6 cm area of more confluent nodularity. No pleural effusion. Airways are patent. Upper Abdomen: Cholelithiasis. Low-density lesion in the RIGHT hepatic lobe measuring water density likely a small cyst another adjacent to caudate and RIGHT hepatic lobe with similar imaging characteristics. Adrenal glands are normal. Kidneys are incompletely imaged. No acute upper abdominal process to the extent evaluated. Musculoskeletal: No acute musculoskeletal process. Spinal degenerative changes. Review of the MIP images confirms the above findings. IMPRESSION: 1. Signs of subsegmental emboli in the lower lobes, RIGHT upper lobe and lingula. 2. RV to LV ratio approximately 0.8 with mild straightening of the interventricular  septum, not currently meeting criteria for RIGHT heart strain, consider attention on follow-up with echocardiography as indicated. 3. Multifocal pneumonia with background nodular pattern in this patient with history of COVID-19. Follow-up may be helpful to assess changes and or resolution. 4. Cholelithiasis. 5. Low-density lesion in the RIGHT hepatic lobe measuring water density likely a small cyst. 6. Aortic atherosclerosis. Critical Value/emergent results were called by telephone at the time of interpretation on 12/30/2019 at 10:29 am to provider Loletha Grayer , who verbally acknowledged these results. Aortic Atherosclerosis (ICD10-I70.0). Electronically Signed   By: Zetta Bills M.D.   On: 12/30/2019 10:29   US Venous Img Lower Bilateral (DVT)  Result Date: 12/30/2019 CLINICAL DATA:  Pulmonary emboli. EXAM: BILATERAL LOWER EXTREMITY VENOUS DOPPLER ULTRASOUND TECHNIQUE: Gray-scale sonography with graded compression, as well as color Doppler and duplex ultrasound were performed to evaluate the lower extremity deep venous systems from the level of the common femoral vein and including the common femoral, femoral, profunda femoral, popliteal and calf veins including the posterior tibial, peroneal and gastrocnemius veins when visible. The superficial great saphenous vein was also interrogated. Spectral Doppler was utilized to evaluate flow at rest and with distal augmentation maneuvers in the common femoral, femoral and popliteal veins. COMPARISON:  None. FINDINGS: RIGHT LOWER EXTREMITY Common Femoral Vein: No evidence of thrombus. Normal compressibility, respiratory phasicity and response to augmentation. Saphenofemoral Junction: No evidence of thrombus. Normal compressibility and flow on color Doppler imaging. Profunda Femoral Vein: No evidence of thrombus. Normal compressibility and flow on color Doppler imaging. Femoral Vein: No evidence of thrombus. Normal compressibility, respiratory phasicity and response  to augmentation. Popliteal Vein: No evidence of thrombus. Normal compressibility, respiratory phasicity and response to augmentation. Calf Veins: No evidence of thrombus. Normal compressibility and flow on color Doppler imaging. Superficial Great Saphenous Vein: No evidence of thrombus. Normal compressibility. Venous Reflux:  None. Other Findings: Small fluid collection in the right popliteal fossa measuring up to 1.4 cm, likely Baker's cyst. LEFT LOWER EXTREMITY Common Femoral Vein: No evidence of thrombus. Normal compressibility, respiratory phasicity and response to augmentation. Saphenofemoral Junction: No evidence of thrombus. Normal compressibility and flow on color Doppler imaging. Profunda Femoral Vein: No evidence of thrombus. Normal compressibility and flow on color Doppler imaging. Femoral Vein: No evidence of thrombus. Normal compressibility, respiratory phasicity and response to augmentation. Popliteal Vein: No evidence of thrombus. Normal compressibility, respiratory phasicity and response to augmentation. Calf Veins: No evidence of thrombus. Normal compressibility and flow on  color Doppler imaging. Superficial Great Saphenous Vein: No evidence of thrombus. Normal compressibility. Venous Reflux:  None. Other Findings: Small fluid collection within the left popliteal fossa measuring up to 1.8 cm, likely Baker's cyst. IMPRESSION: 1. No evidence of deep venous thrombosis in either lower extremity. 2. Small bilateral popliteal fossa fluid collections, likely representing Baker's cysts. Electronically Signed   By: Davina Poke D.O.   On: 12/30/2019 12:15   DG Chest Port 1 View  Result Date: 12/29/2019 CLINICAL DATA:  Fever, shortness of breath. EXAM: PORTABLE CHEST 1 VIEW COMPARISON:  None. FINDINGS: The heart size and mediastinal contours are within normal limits. No pneumothorax or pleural effusion is noted. Multiple airspace opacities are noted throughout both lungs concerning for multifocal  pneumonia, potentially due to COVID-19. The visualized skeletal structures are unremarkable. IMPRESSION: Multiple bilateral airspace opacities are noted concerning for multifocal pneumonia, potentially due to COVID-19. Electronically Signed   By: Marijo Conception M.D.   On: 12/29/2019 14:57    Scheduled Meds: . ascorbic acid  1,000 mg Oral Daily  . baricitinib  4 mg Oral Daily  . enoxaparin (LOVENOX) injection  1 mg/kg Subcutaneous Q12H  . Ipratropium-Albuterol  2 puff Inhalation QID  . methylPREDNISolone (SOLU-MEDROL) injection  1 mg/kg Intravenous Q12H   Followed by  . [START ON 01/01/2020] predniSONE  50 mg Oral Daily  . zinc sulfate  220 mg Oral Daily   Continuous Infusions: . remdesivir 100 mg in NS 100 mL Stopped (12/30/19 1117)    Assessment/Plan:  1. Acute hypoxic respiratory failure secondary to COVID-19 pneumonia.  Patient on 100% nonrebreather and 15 L high flow nasal cannula.  On baricitinib.  Patient stated he would like to not be on the ventilator and switched his CODE STATUS from full code over to partial code.  Wanted CPR done but no ventilator. 2. COVID-19 pneumonia.  Continue remdesivir day 2, Solu-Medrol and baricitinib. 3. Acute pulmonary emboli.  I ordered a CT scan of the chest this morning because his fibrin derivatives elevated above 7500.  I did give Lovenox high-dose injection this morning and will continue that today.  Ultrasound the lower extremities also ordered was negative. 4. Hyponatremia improved 5. Impaired fasting glucose send off a hemoglobin A1c     Code Status:     Code Status Orders  (From admission, onward)         Start     Ordered   12/29/19 1737  Full code  Continuous        12/29/19 1740        Code Status History    This patient has a current code status but no historical code status.   Advance Care Planning Activity    Advance Directive Documentation     Most Recent Value  Type of Advance Directive Healthcare Power of Attorney,  Living will  Pre-existing out of facility DNR order (yellow form or pink MOST form) --  "MOST" Form in Place? --     Patient change his CODE STATUS from full code over to partial code with no ventilator okay to do CPR.  Family Communication: Spoke with wife on the phone Disposition Plan: Status is: Inpatient  Dispo: The patient is from: Home              Anticipated d/c is to: Home              Anticipated d/c date is: Because the patient is on so much oxygen unable to determine  at this time              Patient currently being treated for acute on chronic hypoxic respiratory failure secondary to COVID-19 pneumonia.  Patient also has acute pulmonary emboli.  Time spent: 32 minutes  [place]

## 2019-12-30 NOTE — ED Notes (Signed)
Pt given toothbrush, toothpaste, and deodorant. Pt washed face w hot washcloth and denies any further needs.

## 2019-12-30 NOTE — ED Notes (Signed)
Resumed care from Mac, RN. Pt resting comfortably with high flow O2 and NRB mask. VSS. Will continue to monitor.

## 2019-12-30 NOTE — ED Notes (Signed)
Pt returned from CT; given breakfast tray. When he removes NRB to eat, his O2 sat drops to mid 80's. He states he will use NRB and eat intermittently.

## 2019-12-30 NOTE — Consult Note (Signed)
[place] for Lovenox Indication: pulmonary embolus  No Known Allergies  Patient Measurements: Height: 5\' 9"  (175.3 cm) Weight: 77.1 kg (170 lb) IBW/kg (Calculated) : 70.7 Heparin Dosing Weight: 77.1 kg   Vital Signs: BP: 106/54 (09/07 0730) Pulse Rate: 57 (09/07 0730)  Labs: Recent Labs    12/29/19 1419 12/30/19 0624  HGB 14.7 13.7  HCT 40.2 38.0*  PLT 438* 276  CREATININE 1.09 0.97  TROPONINIHS 16  --     Estimated Creatinine Clearance: 74.9 mL/min (by C-G formula based on SCr of 0.97 mg/dL).   Medications:  No PTA anticoagulants   Assessment: Patient is COVID + and pharmacy has been consulted for Lovenox dosing for patient with suspected PE (studies pending at this time, per consult). Patient last received Lovenox 40 mg Q24H on 12/29/2019 @ 2134.    Goal of Therapy:  Monitor platelets by anticoagulation protocol: Yes   Plan:  Lovenox 1 mg/kg Q12H - will start now -Pharmacy will follow CBC and Scr every three days, per protocol  Perrin Gens R Kadin Bera 12/30/2019,8:52 AM

## 2019-12-30 NOTE — Plan of Care (Signed)
  Problem: Education: Goal: Knowledge of General Education information will improve Description: Including pain rating scale, medication(s)/side effects and non-pharmacologic comfort measures Outcome: Progressing   Problem: Clinical Measurements: Goal: Diagnostic test results will improve Outcome: Progressing Goal: Respiratory complications will improve Outcome: Progressing   Problem: Elimination: Goal: Will not experience complications related to bowel motility Outcome: Progressing Goal: Will not experience complications related to urinary retention Outcome: Progressing   Problem: Pain Managment: Goal: General experience of comfort will improve Outcome: Progressing

## 2019-12-30 NOTE — ED Notes (Signed)
Dr Weiting at bedside. 

## 2019-12-30 NOTE — ED Notes (Signed)
Pt hits call light requesting urinal to be emptied at this time, emptied by this nurse. Pt resting comfortably and has no needs expressed. Call light by right hand

## 2019-12-31 LAB — COMPREHENSIVE METABOLIC PANEL
ALT: 69 U/L — ABNORMAL HIGH (ref 0–44)
AST: 39 U/L (ref 15–41)
Albumin: 2.3 g/dL — ABNORMAL LOW (ref 3.5–5.0)
Alkaline Phosphatase: 61 U/L (ref 38–126)
Anion gap: 8 (ref 5–15)
BUN: 35 mg/dL — ABNORMAL HIGH (ref 8–23)
CO2: 25 mmol/L (ref 22–32)
Calcium: 8 mg/dL — ABNORMAL LOW (ref 8.9–10.3)
Chloride: 103 mmol/L (ref 98–111)
Creatinine, Ser: 0.98 mg/dL (ref 0.61–1.24)
GFR calc Af Amer: >60 mL/min (ref >60)
GFR calc non Af Amer: >60 mL/min (ref >60)
Glucose, Bld: 154 mg/dL — ABNORMAL HIGH (ref 70–99)
Potassium: 5 mmol/L (ref 3.5–5.1)
Sodium: 136 mmol/L (ref 135–145)
Total Bilirubin: 1 mg/dL (ref 0.3–1.2)
Total Protein: 5.7 g/dL — ABNORMAL LOW (ref 6.5–8.1)

## 2019-12-31 LAB — CBC WITH DIFFERENTIAL/PLATELET
Abs Immature Granulocytes: 0.29 10*3/uL — ABNORMAL HIGH (ref 0.00–0.07)
Basophils Absolute: 0 10*3/uL (ref 0.0–0.1)
Basophils Relative: 0 %
Eosinophils Absolute: 0 10*3/uL (ref 0.0–0.5)
Eosinophils Relative: 0 %
HCT: 38.1 % — ABNORMAL LOW (ref 39.0–52.0)
Hemoglobin: 13.3 g/dL (ref 13.0–17.0)
Immature Granulocytes: 3 %
Lymphocytes Relative: 5 %
Lymphs Abs: 0.6 10*3/uL — ABNORMAL LOW (ref 0.7–4.0)
MCH: 29.8 pg (ref 26.0–34.0)
MCHC: 34.9 g/dL (ref 30.0–36.0)
MCV: 85.2 fL (ref 80.0–100.0)
Monocytes Absolute: 0.2 10*3/uL (ref 0.1–1.0)
Monocytes Relative: 2 %
Neutro Abs: 10.6 10*3/uL — ABNORMAL HIGH (ref 1.7–7.7)
Neutrophils Relative %: 90 %
Platelets: 341 10*3/uL (ref 150–400)
RBC: 4.47 MIL/uL (ref 4.22–5.81)
RDW: 13.6 % (ref 11.5–15.5)
WBC: 11.8 10*3/uL — ABNORMAL HIGH (ref 4.0–10.5)
nRBC: 0 % (ref 0.0–0.2)

## 2019-12-31 LAB — FERRITIN: Ferritin: 1554 ng/mL — ABNORMAL HIGH (ref 24–336)

## 2019-12-31 LAB — FIBRIN DERIVATIVES D-DIMER (ARMC ONLY): Fibrin derivatives D-dimer (ARMC): >7500 ng/mL (FEU) — ABNORMAL HIGH (ref 0.00–499.00)

## 2019-12-31 LAB — C-REACTIVE PROTEIN: CRP: 14.3 mg/dL — ABNORMAL HIGH (ref <1.0)

## 2019-12-31 LAB — PROCALCITONIN: Procalcitonin: 0.52 ng/mL

## 2019-12-31 LAB — GLUCOSE, CAPILLARY: Glucose-Capillary: 109 mg/dL — ABNORMAL HIGH (ref 70–99)

## 2019-12-31 MED ORDER — SODIUM CHLORIDE 0.9 % IV SOLN
500.0000 mg | INTRAVENOUS | Status: DC
  Administered 2019-12-31 – 2020-01-01 (×2): 500 mg via INTRAVENOUS
  Filled 2019-12-31 (×2): qty 500

## 2019-12-31 MED ORDER — POLYETHYLENE GLYCOL 3350 17 G PO PACK
17.0000 g | PACK | Freq: Every day | ORAL | Status: DC
  Administered 2019-12-31 – 2020-01-05 (×2): 17 g via ORAL
  Filled 2019-12-31 (×5): qty 1

## 2019-12-31 MED ORDER — SODIUM CHLORIDE 0.9 % IV SOLN
INTRAVENOUS | Status: DC | PRN
  Administered 2019-12-31: 250 mL via INTRAVENOUS

## 2019-12-31 MED ORDER — INSULIN ASPART 100 UNIT/ML ~~LOC~~ SOLN: 0.0000 [IU] | Freq: Every day | SUBCUTANEOUS | Status: DC

## 2019-12-31 MED ORDER — RIVAROXABAN 20 MG PO TABS: 20.0000 mg | ORAL_TABLET | Freq: Every day | ORAL | Status: DC

## 2019-12-31 MED ORDER — RIVAROXABAN 15 MG PO TABS
15.0000 mg | ORAL_TABLET | Freq: Two times a day (BID) | ORAL | Status: DC
  Administered 2019-12-31 – 2020-01-07 (×14): 15 mg via ORAL
  Filled 2019-12-31 (×18): qty 1

## 2019-12-31 MED ORDER — SODIUM CHLORIDE 0.9 % IV SOLN
2.0000 g | INTRAVENOUS | Status: DC
  Administered 2019-12-31 – 2020-01-01 (×2): 2 g via INTRAVENOUS
  Filled 2019-12-31 (×2): qty 2

## 2019-12-31 MED ORDER — INSULIN ASPART 100 UNIT/ML ~~LOC~~ SOLN
0.0000 [IU] | Freq: Three times a day (TID) | SUBCUTANEOUS | Status: DC
  Administered 2020-01-01: 14:00:00 3 [IU] via SUBCUTANEOUS
  Administered 2020-01-03 (×2): 2 [IU] via SUBCUTANEOUS
  Administered 2020-01-04: 3 [IU] via SUBCUTANEOUS
  Administered 2020-01-05 (×2): 2 [IU] via SUBCUTANEOUS
  Administered 2020-01-06: 3 [IU] via SUBCUTANEOUS
  Administered 2020-01-06: 12:00:00 2 [IU] via SUBCUTANEOUS
  Administered 2020-01-07: 12:00:00 3 [IU] via SUBCUTANEOUS
  Filled 2019-12-31 (×9): qty 1

## 2019-12-31 NOTE — Consult Note (Signed)
ANTICOAGULATION CONSULT NOTE - Initial Consult  Pharmacy Consult for Transition to Xarelto from Lovenox Indication: pulmonary embolus  No Known Allergies  Patient Measurements: Height: 5\' 9"  (175.3 cm) Weight: 77.1 kg (170 lb) IBW/kg (Calculated) : 70.7  Vital Signs: Temp: 97.7 F (36.5 C) (09/08 1556) Temp Source: Oral (09/08 1556) BP: 131/73 (09/08 1556) Pulse Rate: 79 (09/08 1556)  Labs: Recent Labs    12/29/19 1419 12/29/19 1419 12/30/19 0624 12/31/19 0501  HGB 14.7   < > 13.7 13.3  HCT 40.2  --  38.0* 38.1*  PLT 438*  --  276 341  CREATININE 1.09  --  0.97 0.98  TROPONINIHS 16  --   --   --    < > = values in this interval not displayed.    Estimated Creatinine Clearance: 74.1 mL/min (by C-G formula based on SCr of 0.98 mg/dL).   Medical History: Past Medical History:  Diagnosis Date  . Basal cell carcinoma 01/09/2018   R ant to the any nasal alar crease nose  . Basal cell carcinoma 11/25/2018   L mid lat neck  . Hypertension     Medications:  Scheduled:  . ascorbic acid  1,000 mg Oral Daily  . baricitinib  4 mg Oral Daily  . [START ON 01/01/2020] insulin aspart  0-15 Units Subcutaneous TID WC  . insulin aspart  0-5 Units Subcutaneous QHS  . Ipratropium-Albuterol  2 puff Inhalation QID  . methylPREDNISolone (SOLU-MEDROL) injection  1 mg/kg Intravenous Q12H   Followed by  . [START ON 01/01/2020] predniSONE  50 mg Oral Daily  . pneumococcal 23 valent vaccine  0.5 mL Intramuscular Tomorrow-1000  . polyethylene glycol  17 g Oral Daily  . Rivaroxaban  15 mg Oral BID WC   Followed by  . [START ON 01/21/2020] rivaroxaban  20 mg Oral Q supper  . zinc sulfate  220 mg Oral Daily    Assessment: 69 year old male admitted 12/29/19 with 1 week history of cough and COVID exposure. Pt has no significant PMH. Pt is COVID positive. D-Dimer > 7500 - pt found to have PE on CT chest. Pt was started on Lovenox for PE - pharmacy now consulted to transition to [employer].    Goal of Therapy:  Monitor platelets by anticoagulation protocol: Yes   Plan:  -Discontinue Lovenox -Start Xarelto 0908 @2000 , 2 hours before next scheduled Lovenox dose -Pt to take 15 mg BID until 9/29 when pt will begin taking 20 mg daily -Monitor CBC with AM labs  Benn Moulder, PharmD Pharmacy Resident  12/31/2019 6:13 PM

## 2019-12-31 NOTE — Progress Notes (Signed)
PROGRESS NOTE    [name]  HKV:[id number] DOB: 1950/09/23 DOA: 12/29/2019 PCP: System, Pcp Not In   Brief Narrative: Taken from H&P. Brett Maldonado is a 69 y.o. M with no significant PMHx who presents with 1 week cough and COVID exposure.Pt. is unvaccinated. Admitted for COVID-19 pneumonia.  Requiring up to 15L of O2.  Subjective: Still becoming SOB with minor exertion. On 10L and non breather, dropping saturation when taken off from non breather while eating. No BM for the past 3 days.  Assessment & Plan:   Principal Problem:   COVID-19 Active Problems:   Transaminitis   Hyponatremia   Essential hypertension   Acute on chronic respiratory failure with hypoxia (HCC)   Pneumonia due to COVID-19 virus  Acute hypoxic respiratory failure secondary to COVID-19 pneumonia. Continue to require up to 15 L of oxygen with HFNC along with nonrebreather. Elevated inflammatory markers. D-dimer remains above 7500. Some improvement in CRP which was 14.3 today. Peaked at 20.8. CTA done yesterday with subsegmental lower lobes emboli. Lower extremity venous Doppler was negative for DVT.  Procalcitonin elevated at 0.52. -Continue remdesivir and Solu-Medrol-day 3. -Continue baricitinib-day 3 -Start him on ceftriaxone and Zithromax for possible superadded bacterial infection due to elevated procalcitonin. -Continue supportive care and supplement. -Continue supplemental oxygen to keep the saturation above 90%.  Pulmonary embolism. Most likely secondary to COVID-19 infection. Patient was on therapeutic Lovenox. -I will switch Lovenox with Xarelto.  Hyperglycemia. A1c of 6.1. No prior diagnosis of diabetes. Patient is on steroid. -Add sliding scale.  Constipation. -Add bowel regimen with MiraLAX daily.  Objective: Vitals:   12/30/19 1944 12/30/19 2309 12/31/19 0348 12/31/19 0803  BP: (!) 149/75 (!) 141/75 119/70 (!) 154/79  Pulse: 81 77 69 75  Resp: 16 20 (!) 22 18  Temp: 98.1 F  (36.7 C) 97.9 F (36.6 C) 98 F (36.7 C) 97.8 F (36.6 C)  TempSrc: Oral Oral Oral Oral  SpO2: 98% 100% 96% 98%  Weight:      Height:        Intake/Output Summary (Last 24 hours) at 12/31/2019 0850 Last data filed at 12/31/2019 5643 Gross per 24 hour  Intake 220 ml  Output 675 ml  Net -455 ml   Filed Weights   12/29/19 1417  Weight: 77.1 kg    Examination:  General exam: Appears calm and comfortable  Respiratory system: Few scattered dry crackles, Respiratory effort normal. Cardiovascular system: S1 & S2 heard, RRR. No JVD, murmurs, Gastrointestinal system: Soft, nontender, nondistended, bowel sounds positive. Central nervous system: Alert and oriented. No focal neurological deficits. Extremities: No edema, no cyanosis, pulses intact and symmetrical. Skin: No rashes, lesions or ulcers Psychiatry: Judgement and insight appear normal. Mood & affect appropriate.    DVT prophylaxis: Xarelto Code Status: Full Family Communication: Wife was updated on phone. Disposition Plan:  Status is: Inpatient  Remains inpatient appropriate because:Inpatient level of care appropriate due to severity of illness   Dispo: The patient is from: Home              Anticipated d/c is to: Home              Anticipated d/c date is: 3 days              Patient currently is not medically stable to d/c.   Consultants:   Procedures:  Antimicrobials:  Rocephin Azithromax  Data Reviewed: I have personally reviewed following labs and imaging studies  CBC: Recent Labs  Lab 12/29/19 1419 12/30/19 0624 12/31/19 0501  WBC 15.9* 8.7 11.8*  NEUTROABS 14.7* 7.7 10.6*  HGB 14.7 13.7 13.3  HCT 40.2 38.0* 38.1*  MCV 81.5 83.0 85.2  PLT 438* 276 962   Basic Metabolic Panel: Recent Labs  Lab 12/29/19 1419 12/30/19 0624 12/31/19 0501  NA 127* 134* 136  K 4.0 4.7 5.0  CL 95* 100 103  CO2 17* 25 25  GLUCOSE 182* 149* 154*  BUN 25* 25* 35*  CREATININE 1.09 0.97 0.98  CALCIUM 8.1* 8.0*  8.0*   GFR: Estimated Creatinine Clearance: 74.1 mL/min (by C-G formula based on SCr of 0.98 mg/dL). Liver Function Tests: Recent Labs  Lab 12/29/19 1419 12/30/19 0624 12/31/19 0501  AST 80* 41 39  ALT 102* 76* 69*  ALKPHOS 76 62 61  BILITOT 1.0 0.8 1.0  PROT 6.6 6.0* 5.7*  ALBUMIN 2.8* 2.4* 2.3*   No results for input(s): LIPASE, AMYLASE in the last 168 hours. No results for input(s): AMMONIA in the last 168 hours. Coagulation Profile: No results for input(s): INR, PROTIME in the last 168 hours. Cardiac Enzymes: No results for input(s): CKTOTAL, CKMB, CKMBINDEX, TROPONINI in the last 168 hours. BNP (last 3 results) No results for input(s): PROBNP in the last 8760 hours. HbA1C: Recent Labs    12/30/19 1329  HGBA1C 6.1*   CBG: No results for input(s): GLUCAP in the last 168 hours. Lipid Profile: Recent Labs    12/29/19 1418  TRIG 163*   Thyroid Function Tests: No results for input(s): TSH, T4TOTAL, FREET4, T3FREE, THYROIDAB in the last 72 hours. Anemia Panel: Recent Labs    12/30/19 0624 12/31/19 0501  FERRITIN 1,327* 1,554*   Sepsis Labs: Recent Labs  Lab 12/29/19 1419 12/29/19 1533 12/31/19 0501  PROCALCITON 0.77  --  0.52  LATICACIDVEN  --  3.6*  --     Recent Results (from the past 240 hour(s))  Blood Culture (routine x 2)     Status: None (Preliminary result)   Collection Time: 12/29/19  2:19 PM   Specimen: BLOOD  Result Value Ref Range Status   Specimen Description BLOOD LEFT ANTECUBITAL  Final   Special Requests   Final    BOTTLES DRAWN AEROBIC AND ANAEROBIC Blood Culture adequate volume   Culture   Final    NO GROWTH < 24 HOURS Performed at [facility], [address] [place], [place] [address]    Report Status PENDING  Incomplete  Blood Culture (routine x 2)     Status: None (Preliminary result)   Collection Time: 12/29/19  2:19 PM   Specimen: BLOOD  Result Value Ref Range Status   Specimen Description BLOOD RIGHT  ANTECUBITAL  Final   Special Requests   Final    BOTTLES DRAWN AEROBIC AND ANAEROBIC Blood Culture adequate volume   Culture   Final    NO GROWTH < 24 HOURS Performed at [facility], [address], [place], [place] [address]    Report Status PENDING  Incomplete  SARS Coronavirus 2 by RT PCR (hospital order, performed in [place] hospital lab) Nasopharyngeal Nasopharyngeal Swab     Status: Abnormal   Collection Time: 12/29/19  2:20 PM   Specimen: Nasopharyngeal Swab  Result Value Ref Range Status   SARS Coronavirus 2 POSITIVE (A) NEGATIVE Final    Comment: RESULT CALLED TO, READ BACK BY AND VERIFIED WITH: KATIE FERGUSSON @1606  12/29/19 MJU (NOTE) SARS-CoV-2 target nucleic acids are DETECTED  SARS-CoV-2 RNA is generally detectable  in upper respiratory specimens  during the acute phase of infection.  Positive results are indicative  of the presence of the identified virus, but do not rule out bacterial infection or co-infection with other pathogens not detected by the test.  Clinical correlation with patient history and  other diagnostic information is necessary to determine patient infection status.  The expected result is negative.  Fact Sheet for Patients:   StrictlyIdeas.no   Fact Sheet for Healthcare Providers:   BankingDealers.co.za    This test is not yet approved or cleared by the [place] FDA and  has been authorized for detection and/or diagnosis of SARS-CoV-2 by FDA under an Emergency Use Authorization (EUA).  This EUA will remain in effect (meaning this test  can be used) for the duration of  the COVID-19 declaration under Section 564(b)(1) of the Act, 21 U.S.C. section 360-bbb-3(b)(1), unless the authorization is terminated or revoked sooner.  Performed at [facility], [address], [place], [place] [address]      Radiology Studies: CT ANGIO CHEST PE W OR WO CONTRAST  Result Date:  12/30/2019 CLINICAL DATA:  Suspected PE.  Positive D-dimer. EXAM: CT ANGIOGRAPHY CHEST WITH CONTRAST TECHNIQUE: Multidetector CT imaging of the chest was performed using the standard protocol during bolus administration of intravenous contrast. Multiplanar CT image reconstructions and MIPs were obtained to evaluate the vascular anatomy. CONTRAST:  36mL OMNIPAQUE IOHEXOL 350 MG/ML SOLN COMPARISON:  Chest x-ray December 29, 2019 FINDINGS: Cardiovascular: Signs of subsegmental emboli in the lower lobes. Example seen on image 179 of series 5 in the LEFT lower lobe medially. Small subsegmental embolus suspected also on image 227 of series 5 in the RIGHT lower lobe. Segmental embolus at a branch point in the RIGHT lower lobe on image 135 of series 5 and in the RIGHT upper lobe on image 126 of series 5. Scattered small emboli throughout the chest also in the LEFT upper lobe on image 86 of series 5 posteriorly as an additional example of embolic disease. Small embolus in the lingular segmental branch on image 41 of series 7 and in in the anterior LEFT upper lobe branch on image 128 of series 5. No pericardial effusion. RV to LV ratio approximately 0.8 with mild straightening of the interventricular septum. Aorta with scattered atherosclerosis.  No aneurysmal dilation. Mediastinum/Nodes: Borderline enlarged lymph nodes scattered throughout the chest, most likely reactive given parenchymal findings. No axillary lymphadenopathy. No thoracic inlet adenopathy. Lungs/Pleura: Diffuse ground-glass and septal thickening worse at the lung bases. Background nodularity is present within the pattern, for instance on image 46 of series 6 in the LEFT upper lobe a 1.6 cm area of more confluent nodularity. No pleural effusion. Airways are patent. Upper Abdomen: Cholelithiasis. Low-density lesion in the RIGHT hepatic lobe measuring water density likely a small cyst another adjacent to caudate and RIGHT hepatic lobe with similar imaging  characteristics. Adrenal glands are normal. Kidneys are incompletely imaged. No acute upper abdominal process to the extent evaluated. Musculoskeletal: No acute musculoskeletal process. Spinal degenerative changes. Review of the MIP images confirms the above findings. IMPRESSION: 1. Signs of subsegmental emboli in the lower lobes, RIGHT upper lobe and lingula. 2. RV to LV ratio approximately 0.8 with mild straightening of the interventricular septum, not currently meeting criteria for RIGHT heart strain, consider attention on follow-up with echocardiography as indicated. 3. Multifocal pneumonia with background nodular pattern in this patient with history of COVID-19. Follow-up may be helpful to assess changes and or resolution. 4.  Cholelithiasis. 5. Low-density lesion in the RIGHT hepatic lobe measuring water density likely a small cyst. 6. Aortic atherosclerosis. Critical Value/emergent results were called by telephone at the time of interpretation on 12/30/2019 at 10:29 am to provider Loletha Grayer , who verbally acknowledged these results. Aortic Atherosclerosis (ICD10-I70.0). Electronically Signed   By: Zetta Bills M.D.   On: 12/30/2019 10:29   US Venous Img Lower Bilateral (DVT)  Result Date: 12/30/2019 CLINICAL DATA:  Pulmonary emboli. EXAM: BILATERAL LOWER EXTREMITY VENOUS DOPPLER ULTRASOUND TECHNIQUE: Gray-scale sonography with graded compression, as well as color Doppler and duplex ultrasound were performed to evaluate the lower extremity deep venous systems from the level of the common femoral vein and including the common femoral, femoral, profunda femoral, popliteal and calf veins including the posterior tibial, peroneal and gastrocnemius veins when visible. The superficial great saphenous vein was also interrogated. Spectral Doppler was utilized to evaluate flow at rest and with distal augmentation maneuvers in the common femoral, femoral and popliteal veins. COMPARISON:  None. FINDINGS: RIGHT  LOWER EXTREMITY Common Femoral Vein: No evidence of thrombus. Normal compressibility, respiratory phasicity and response to augmentation. Saphenofemoral Junction: No evidence of thrombus. Normal compressibility and flow on color Doppler imaging. Profunda Femoral Vein: No evidence of thrombus. Normal compressibility and flow on color Doppler imaging. Femoral Vein: No evidence of thrombus. Normal compressibility, respiratory phasicity and response to augmentation. Popliteal Vein: No evidence of thrombus. Normal compressibility, respiratory phasicity and response to augmentation. Calf Veins: No evidence of thrombus. Normal compressibility and flow on color Doppler imaging. Superficial Great Saphenous Vein: No evidence of thrombus. Normal compressibility. Venous Reflux:  None. Other Findings: Small fluid collection in the right popliteal fossa measuring up to 1.4 cm, likely Baker's cyst. LEFT LOWER EXTREMITY Common Femoral Vein: No evidence of thrombus. Normal compressibility, respiratory phasicity and response to augmentation. Saphenofemoral Junction: No evidence of thrombus. Normal compressibility and flow on color Doppler imaging. Profunda Femoral Vein: No evidence of thrombus. Normal compressibility and flow on color Doppler imaging. Femoral Vein: No evidence of thrombus. Normal compressibility, respiratory phasicity and response to augmentation. Popliteal Vein: No evidence of thrombus. Normal compressibility, respiratory phasicity and response to augmentation. Calf Veins: No evidence of thrombus. Normal compressibility and flow on color Doppler imaging. Superficial Great Saphenous Vein: No evidence of thrombus. Normal compressibility. Venous Reflux:  None. Other Findings: Small fluid collection within the left popliteal fossa measuring up to 1.8 cm, likely Baker's cyst. IMPRESSION: 1. No evidence of deep venous thrombosis in either lower extremity. 2. Small bilateral popliteal fossa fluid collections, likely  representing Baker's cysts. Electronically Signed   By: Davina Poke D.O.   On: 12/30/2019 12:15   DG Chest Port 1 View  Result Date: 12/29/2019 CLINICAL DATA:  Fever, shortness of breath. EXAM: PORTABLE CHEST 1 VIEW COMPARISON:  None. FINDINGS: The heart size and mediastinal contours are within normal limits. No pneumothorax or pleural effusion is noted. Multiple airspace opacities are noted throughout both lungs concerning for multifocal pneumonia, potentially due to COVID-19. The visualized skeletal structures are unremarkable. IMPRESSION: Multiple bilateral airspace opacities are noted concerning for multifocal pneumonia, potentially due to COVID-19. Electronically Signed   By: Marijo Conception M.D.   On: 12/29/2019 14:57    Scheduled Meds: . ascorbic acid  1,000 mg Oral Daily  . baricitinib  4 mg Oral Daily  . enoxaparin (LOVENOX) injection  1 mg/kg Subcutaneous Q12H  . Ipratropium-Albuterol  2 puff Inhalation QID  . methylPREDNISolone (SOLU-MEDROL) injection  1 mg/kg Intravenous  Q12H   Followed by  . [START ON 01/01/2020] predniSONE  50 mg Oral Daily  . pneumococcal 23 valent vaccine  0.5 mL Intramuscular Tomorrow-1000  . zinc sulfate  220 mg Oral Daily   Continuous Infusions: . remdesivir 100 mg in NS 100 mL Stopped (12/30/19 1117)     LOS: 2 days   Time spent: 40 minutes.  Lorella Nimrod, MD [facility]  If 7PM-7AM, please contact night-coverage [email or web address]  12/31/2019, 8:50 AM   This record has been created using Systems analyst. Errors have been sought and corrected,but may not always be located. Such creation errors do not reflect on the standard of care.

## 2019-12-31 NOTE — TOC Initial Note (Signed)
Transition of Care [facility]) - Initial/Assessment Note    Patient Details  Name: Brett Maldonado MRN: [id number] Date of Birth: 09-25-1950  [facility]) CM/SW Contact:    Shelbie Hutching, RN Phone Number: 12/31/2019, 10:09 AM  Clinical Narrative:                 Patient admitted to the hospital with COVID requiring acute O2 HFNC and NRB 15L.  RNCM was able to speak with patient's wife, Tye Maryland, via phone.  RNCM explained role in discharge planning.  Tye Maryland reports that the patient is completely independent at home and requires no assistive devices.  Patient still works full time for [employer].  Patient is current with his PCP, Dr. Baldemar Lenis at [facility].  Patient is not on any medications at home but if he needs to be discharged with prescriptions they can [facility] [address].  Wife is aware that patient may need oxygen at home when discharged.  Wife thinks that patient will be okay with home health services if he absolutely has to have them but that she and her daughter should be able to care for him once discharged.  Wife reports that they do have a walker at home if needed.  TOC team will cont to follow through discharge.    Expected Discharge Plan: Home/Self Care Barriers to Discharge: Continued Medical Work up   Patient Goals and CMS Choice Patient states their goals for this hospitalization and ongoing recovery are:: To get home and have oxygen at home if needed      Expected Discharge Plan and Services Expected Discharge Plan: Home/Self Care   Discharge Planning Services: CM Consult Post Acute Care Choice: NA Living arrangements for the past 2 months: Single Family Home                           HH Arranged: NA          Prior Living Arrangements/Services Living arrangements for the past 2 months: Single Family Home Lives with:: Spouse, Adult Children Patient language and need for interpreter reviewed:: Yes Do you feel safe going back to the  place where you live?: Yes      Need for Family Participation in Patient Care: Yes (Comment) (COVID) Care giver support system in place?: Yes (comment) (wife and daughter) Current home services: DME (walker) Criminal Activity/Legal Involvement Pertinent to Current Situation/Hospitalization: No - Comment as needed  Activities of Daily Living Home Assistive Devices/Equipment: None ADL Screening (condition at time of admission) Patient's cognitive ability adequate to safely complete daily activities?: Yes Is the patient deaf or have difficulty hearing?: No Does the patient have difficulty seeing, even when wearing glasses/contacts?: No Does the patient have difficulty concentrating, remembering, or making decisions?: No Patient able to express need for assistance with ADLs?: No Does the patient have difficulty dressing or bathing?: No Independently performs ADLs?: Yes (appropriate for developmental age) Does the patient have difficulty walking or climbing stairs?: No Weakness of Legs: Both Weakness of Arms/Hands: None  Permission Sought/Granted Permission sought to share information with : Case Manager, Family Supports Permission granted to share information with : Yes, Verbal Permission Granted  Share Information with NAME: Tye Maryland     Permission granted to share info w Relationship: wife     Emotional Assessment       Orientation: : Oriented to Self, Oriented to Place, Oriented to  Time, Oriented to  Situation Alcohol / Substance Use: Not Applicable Psych Involvement: No (comment)  Admission diagnosis:  Hypoxia [R09.02] Acute respiratory failure with hypoxia (HCC) [J96.01] Acute respiratory failure due to COVID-19 ([place]) [U07.1, J96.00] COVID-19 [U07.1] Patient Active Problem List   Diagnosis Date Noted  . Pneumonia due to COVID-19 virus 12/30/2019  . Pulmonary embolus ([place])   . Impaired fasting glucose   . COVID-19 12/29/2019  . Transaminitis 12/29/2019  . Hyponatremia  12/29/2019  . Essential hypertension 12/29/2019  . Acute on chronic respiratory failure with hypoxia ([place]) 12/29/2019   PCP:  System, Pcp Not In Pharmacy:   [id number] , [place] [address] Phone: [phone] Fax: [phone]     Social Determinants of Health (SDOH) Interventions    Readmission Risk Interventions No flowsheet data found.

## 2020-01-01 LAB — COMPREHENSIVE METABOLIC PANEL
ALT: 62 U/L — ABNORMAL HIGH (ref 0–44)
AST: 38 U/L (ref 15–41)
Albumin: 2.4 g/dL — ABNORMAL LOW (ref 3.5–5.0)
Alkaline Phosphatase: 64 U/L (ref 38–126)
Anion gap: 9 (ref 5–15)
BUN: 33 mg/dL — ABNORMAL HIGH (ref 8–23)
CO2: 25 mmol/L (ref 22–32)
Calcium: 8 mg/dL — ABNORMAL LOW (ref 8.9–10.3)
Chloride: 103 mmol/L (ref 98–111)
Creatinine, Ser: 0.87 mg/dL (ref 0.61–1.24)
GFR calc Af Amer: >60 mL/min (ref >60)
GFR calc non Af Amer: >60 mL/min (ref >60)
Glucose, Bld: 127 mg/dL — ABNORMAL HIGH (ref 70–99)
Potassium: 5 mmol/L (ref 3.5–5.1)
Sodium: 137 mmol/L (ref 135–145)
Total Bilirubin: 1 mg/dL (ref 0.3–1.2)
Total Protein: 5.9 g/dL — ABNORMAL LOW (ref 6.5–8.1)

## 2020-01-01 LAB — CBC WITH DIFFERENTIAL/PLATELET
Abs Immature Granulocytes: 0.26 10*3/uL — ABNORMAL HIGH (ref 0.00–0.07)
Basophils Absolute: 0 10*3/uL (ref 0.0–0.1)
Basophils Relative: 0 %
Eosinophils Absolute: 0 10*3/uL (ref 0.0–0.5)
Eosinophils Relative: 0 %
HCT: 39.5 % (ref 39.0–52.0)
Hemoglobin: 13.4 g/dL (ref 13.0–17.0)
Immature Granulocytes: 2 %
Lymphocytes Relative: 3 %
Lymphs Abs: 0.4 10*3/uL — ABNORMAL LOW (ref 0.7–4.0)
MCH: 29.5 pg (ref 26.0–34.0)
MCHC: 33.9 g/dL (ref 30.0–36.0)
MCV: 87 fL (ref 80.0–100.0)
Monocytes Absolute: 0.2 10*3/uL (ref 0.1–1.0)
Monocytes Relative: 2 %
Neutro Abs: 11.3 10*3/uL — ABNORMAL HIGH (ref 1.7–7.7)
Neutrophils Relative %: 93 %
Platelets: 350 10*3/uL (ref 150–400)
RBC: 4.54 MIL/uL (ref 4.22–5.81)
RDW: 13.8 % (ref 11.5–15.5)
WBC: 12.1 10*3/uL — ABNORMAL HIGH (ref 4.0–10.5)
nRBC: 0 % (ref 0.0–0.2)

## 2020-01-01 LAB — PROCALCITONIN: Procalcitonin: 0.25 ng/mL

## 2020-01-01 LAB — FERRITIN: Ferritin: 1279 ng/mL — ABNORMAL HIGH (ref 24–336)

## 2020-01-01 LAB — FIBRIN DERIVATIVES D-DIMER (ARMC ONLY): Fibrin derivatives D-dimer (ARMC): >7500 ng/mL (FEU) — ABNORMAL HIGH (ref 0.00–499.00)

## 2020-01-01 LAB — GLUCOSE, CAPILLARY
Glucose-Capillary: 94 mg/dL (ref 70–99)
Glucose-Capillary: 156 mg/dL — ABNORMAL HIGH (ref 70–99)
Glucose-Capillary: 97 mg/dL (ref 70–99)
Glucose-Capillary: 100 mg/dL — ABNORMAL HIGH (ref 70–99)

## 2020-01-01 LAB — C-REACTIVE PROTEIN: CRP: 9.2 mg/dL — ABNORMAL HIGH (ref <1.0)

## 2020-01-01 MED ORDER — FLUTICASONE PROPIONATE 50 MCG/ACT NA SUSP
1.0000 | Freq: Every day | NASAL | Status: DC
  Administered 2020-01-01 – 2020-01-07 (×7): 1 via NASAL
  Filled 2020-01-01 (×2): qty 16

## 2020-01-01 MED ORDER — AZITHROMYCIN 500 MG PO TABS
500.0000 mg | ORAL_TABLET | Freq: Once | ORAL | Status: AC
  Administered 2020-01-02: 09:00:00 500 mg via ORAL
  Filled 2020-01-01: qty 1

## 2020-01-01 MED ORDER — SALINE SPRAY 0.65 % NA SOLN
1.0000 | NASAL | Status: DC | PRN
  Administered 2020-01-01 – 2020-01-02 (×2): 1 via NASAL
  Filled 2020-01-01: qty 44

## 2020-01-01 MED ORDER — SODIUM CHLORIDE 0.9 % IV SOLN
1.0000 g | INTRAVENOUS | Status: DC
  Filled 2020-01-01 (×2): qty 10

## 2020-01-01 NOTE — Progress Notes (Signed)
Spoke with wife Tye Maryland and provided updates on pt current medical status and plan of care. No questions at this time. Will update tomorrow. RN to continue to [employer]

## 2020-01-01 NOTE — Progress Notes (Signed)
PROGRESS NOTE    Brett Maldonado  ALP:[id number] DOB: Jan 29, 1951 DOA: 12/29/2019 PCP: System, Pcp Not In   Brief Narrative: Taken from H&P. Brett Maldonado is a 69 y.o. M with no significant PMHx who presents with 1 week cough and COVID exposure.Pt. is unvaccinated. Admitted for COVID-19 pneumonia.  Requiring up to 15L of O2.  Subjective: He was feeling SOB after using bathroom, desaturating in high 80's,improve to 90% with some deep breathing through nose.Continue to require a lot of oxygen.  Assessment & Plan:   Principal Problem:   COVID-19 Active Problems:   Transaminitis   Hyponatremia   Essential hypertension   Acute on chronic respiratory failure with hypoxia (HCC)   Pneumonia due to COVID-19 virus  Acute hypoxic respiratory failure secondary to COVID-19 pneumonia. Continue to require up to 15 L of oxygen with HFNC along with nonrebreather. Elevated inflammatory markers. D-dimer remains above 7500. Some improvement in CRP which was 9.2 today. Peaked at 20.8. CTA  with subsegmental lower lobes emboli. Lower extremity venous Doppler was negative for DVT. Procalcitonin elevated at 0.52>>0.25 -Continue remdesivir and Solu-Medrol-day 3. -Continue baricitinib-day 3-we will continue baricitinib at this time although there is some leukocytosis.  If that continue to get worse we might have to discontinue baricitinib.  Patient is also on steroid which can cause leukocytosis.  Procalcitonin improving at this time. -Start him on ceftriaxone and Zithromax for possible superadded bacterial infection due to elevated procalcitonin. -Continue supportive care and supplement. -Continue supplemental oxygen to keep the saturation above 90%.  Pulmonary embolism. Most likely secondary to COVID-19 infection. Patient was on therapeutic Lovenox, switched to Xarelto yesterday. -Continue Xarelto.  Hyperglycemia. A1c of 6.1. No prior diagnosis of diabetes. Patient is on steroid. -Continue sliding  scale.  Constipation. -Add bowel regimen with MiraLAX daily.  Objective: Vitals:   12/31/19 2007 12/31/19 2341 01/01/20 0441 01/01/20 0748  BP: (!) 143/69 (!) 136/57 (!) 125/55 (!) 141/68  Pulse: 86 81 73 82  Resp: (!) 22 20 18 18   Temp: 98 F (36.7 C) 98.4 F (36.9 C) 97.8 F (36.6 C) 98.4 F (36.9 C)  TempSrc: Oral Oral Oral Oral  SpO2: 98% 95% 96% 93%  Weight:      Height:        Intake/Output Summary (Last 24 hours) at 01/01/2020 0841 Last data filed at 01/01/2020 0600 Gross per 24 hour  Intake 590.25 ml  Output 1500 ml  Net -909.75 ml   Filed Weights   12/29/19 1417  Weight: 77.1 kg    Examination:  General. Well developed gentleman, In no acute distress. Pulmonary.  Lungs clear bilaterally, normal respiratory effort. CV.  Regular rate and rhythm, no JVD, rub or murmur. Abdomen.  Soft, nontender, nondistended, BS positive. CNS.  Alert and oriented x3.  No focal neurologic deficit. Extremities.  No edema, no cyanosis, pulses intact and symmetrical. Psychiatry.  Judgment and insight appears normal.  DVT prophylaxis: Xarelto Code Status: Full Family Communication: Wife was updated on phone. Disposition Plan:  Status is: Inpatient  Remains inpatient appropriate because:Inpatient level of care appropriate due to severity of illness   Dispo: The patient is from: Home              Anticipated d/c is to: Home              Anticipated d/c date is: >3 days.              Patient currently is not medically stable to d/c.  Consultants:   Procedures:  Antimicrobials:  Rocephin Azithromax  Data Reviewed: I have personally reviewed following labs and imaging studies  CBC: Recent Labs  Lab 12/29/19 1419 12/30/19 0624 12/31/19 0501 01/01/20 0458  WBC 15.9* 8.7 11.8* 12.1*  NEUTROABS 14.7* 7.7 10.6* 11.3*  HGB 14.7 13.7 13.3 13.4  HCT 40.2 38.0* 38.1* 39.5  MCV 81.5 83.0 85.2 87.0  PLT 438* 276 341 417   Basic Metabolic Panel: Recent Labs  Lab  12/29/19 1419 12/30/19 0624 12/31/19 0501 01/01/20 0458  NA 127* 134* 136 137  K 4.0 4.7 5.0 5.0  CL 95* 100 103 103  CO2 17* 25 25 25   GLUCOSE 182* 149* 154* 127*  BUN 25* 25* 35* 33*  CREATININE 1.09 0.97 0.98 0.87  CALCIUM 8.1* 8.0* 8.0* 8.0*   GFR: Estimated Creatinine Clearance: 83.5 mL/min (by C-G formula based on SCr of 0.87 mg/dL). Liver Function Tests: Recent Labs  Lab 12/29/19 1419 12/30/19 0624 12/31/19 0501 01/01/20 0458  AST 80* 41 39 38  ALT 102* 76* 69* 62*  ALKPHOS 76 62 61 64  BILITOT 1.0 0.8 1.0 1.0  PROT 6.6 6.0* 5.7* 5.9*  ALBUMIN 2.8* 2.4* 2.3* 2.4*   No results for input(s): LIPASE, AMYLASE in the last 168 hours. No results for input(s): AMMONIA in the last 168 hours. Coagulation Profile: No results for input(s): INR, PROTIME in the last 168 hours. Cardiac Enzymes: No results for input(s): CKTOTAL, CKMB, CKMBINDEX, TROPONINI in the last 168 hours. BNP (last 3 results) No results for input(s): PROBNP in the last 8760 hours. HbA1C: Recent Labs    12/30/19 1329  HGBA1C 6.1*   CBG: Recent Labs  Lab 12/31/19 2126 01/01/20 0746  GLUCAP 109* 94   Lipid Profile: Recent Labs    12/29/19 1418  TRIG 163*   Thyroid Function Tests: No results for input(s): TSH, T4TOTAL, FREET4, T3FREE, THYROIDAB in the last 72 hours. Anemia Panel: Recent Labs    12/31/19 0501 01/01/20 0458  FERRITIN 1,554* 1,279*   Sepsis Labs: Recent Labs  Lab 12/29/19 1419 12/29/19 1533 12/31/19 0501 01/01/20 0458  PROCALCITON 0.77  --  0.52 0.25  LATICACIDVEN  --  3.6*  --   --     Recent Results (from the past 240 hour(s))  Blood Culture (routine x 2)     Status: None (Preliminary result)   Collection Time: 12/29/19  2:19 PM   Specimen: BLOOD  Result Value Ref Range Status   Specimen Description BLOOD LEFT ANTECUBITAL  Final   Special Requests   Final    BOTTLES DRAWN AEROBIC AND ANAEROBIC Blood Culture adequate volume   Culture   Final    NO GROWTH 3  DAYS Performed at [facility], [address], [place], [place] [address]    Report Status PENDING  Incomplete  Blood Culture (routine x 2)     Status: None (Preliminary result)   Collection Time: 12/29/19  2:19 PM   Specimen: BLOOD  Result Value Ref Range Status   Specimen Description BLOOD RIGHT ANTECUBITAL  Final   Special Requests   Final    BOTTLES DRAWN AEROBIC AND ANAEROBIC Blood Culture adequate volume   Culture   Final    NO GROWTH 3 DAYS Performed at [facility], [address], [place],  [address]    Report Status PENDING  Incomplete  SARS Coronavirus 2 by RT PCR (hospital order, performed in [place] hospital lab) Nasopharyngeal Nasopharyngeal Swab     Status:  Abnormal   Collection Time: 12/29/19  2:20 PM   Specimen: Nasopharyngeal Swab  Result Value Ref Range Status   SARS Coronavirus 2 POSITIVE (A) NEGATIVE Final    Comment: RESULT CALLED TO, READ BACK BY AND VERIFIED WITH: KATIE FERGUSSON @1606  12/29/19 MJU (NOTE) SARS-CoV-2 target nucleic acids are DETECTED  SARS-CoV-2 RNA is generally detectable in upper respiratory specimens  during the acute phase of infection.  Positive results are indicative  of the presence of the identified virus, but do not rule out bacterial infection or co-infection with other pathogens not detected by the test.  Clinical correlation with patient history and  other diagnostic information is necessary to determine patient infection status.  The expected result is negative.  Fact Sheet for Patients:   StrictlyIdeas.no   Fact Sheet for Healthcare Providers:   BankingDealers.co.za    This test is not yet approved or cleared by the [place] FDA and  has been authorized for detection and/or diagnosis of SARS-CoV-2 by FDA under an Emergency Use Authorization (EUA).  This EUA will remain in effect (meaning this test  can be used) for the duration of  the  COVID-19 declaration under Section 564(b)(1) of the Act, 21 U.S.C. section 360-bbb-3(b)(1), unless the authorization is terminated or revoked sooner.  Performed at [facility], [address], [place], [place] [address]      Radiology Studies: CT ANGIO CHEST PE W OR WO CONTRAST  Result Date: 12/30/2019 CLINICAL DATA:  Suspected PE.  Positive D-dimer. EXAM: CT ANGIOGRAPHY CHEST WITH CONTRAST TECHNIQUE: Multidetector CT imaging of the chest was performed using the standard protocol during bolus administration of intravenous contrast. Multiplanar CT image reconstructions and MIPs were obtained to evaluate the vascular anatomy. CONTRAST:  80mL OMNIPAQUE IOHEXOL 350 MG/ML SOLN COMPARISON:  Chest x-ray December 29, 2019 FINDINGS: Cardiovascular: Signs of subsegmental emboli in the lower lobes. Example seen on image 179 of series 5 in the LEFT lower lobe medially. Small subsegmental embolus suspected also on image 227 of series 5 in the RIGHT lower lobe. Segmental embolus at a branch point in the RIGHT lower lobe on image 135 of series 5 and in the RIGHT upper lobe on image 126 of series 5. Scattered small emboli throughout the chest also in the LEFT upper lobe on image 86 of series 5 posteriorly as an additional example of embolic disease. Small embolus in the lingular segmental branch on image 41 of series 7 and in in the anterior LEFT upper lobe branch on image 128 of series 5. No pericardial effusion. RV to LV ratio approximately 0.8 with mild straightening of the interventricular septum. Aorta with scattered atherosclerosis.  No aneurysmal dilation. Mediastinum/Nodes: Borderline enlarged lymph nodes scattered throughout the chest, most likely reactive given parenchymal findings. No axillary lymphadenopathy. No thoracic inlet adenopathy. Lungs/Pleura: Diffuse ground-glass and septal thickening worse at the lung bases. Background nodularity is present within the pattern, for instance on image 46 of  series 6 in the LEFT upper lobe a 1.6 cm area of more confluent nodularity. No pleural effusion. Airways are patent. Upper Abdomen: Cholelithiasis. Low-density lesion in the RIGHT hepatic lobe measuring water density likely a small cyst another adjacent to caudate and RIGHT hepatic lobe with similar imaging characteristics. Adrenal glands are normal. Kidneys are incompletely imaged. No acute upper abdominal process to the extent evaluated. Musculoskeletal: No acute musculoskeletal process. Spinal degenerative changes. Review of the MIP images confirms the above findings. IMPRESSION: 1. Signs of subsegmental emboli in the lower lobes,  RIGHT upper lobe and lingula. 2. RV to LV ratio approximately 0.8 with mild straightening of the interventricular septum, not currently meeting criteria for RIGHT heart strain, consider attention on follow-up with echocardiography as indicated. 3. Multifocal pneumonia with background nodular pattern in this patient with history of COVID-19. Follow-up may be helpful to assess changes and or resolution. 4. Cholelithiasis. 5. Low-density lesion in the RIGHT hepatic lobe measuring water density likely a small cyst. 6. Aortic atherosclerosis. Critical Value/emergent results were called by telephone at the time of interpretation on 12/30/2019 at 10:29 am to provider Loletha Grayer , who verbally acknowledged these results. Aortic Atherosclerosis (ICD10-I70.0). Electronically Signed   By: Zetta Bills M.D.   On: 12/30/2019 10:29   US Venous Img Lower Bilateral (DVT)  Result Date: 12/30/2019 CLINICAL DATA:  Pulmonary emboli. EXAM: BILATERAL LOWER EXTREMITY VENOUS DOPPLER ULTRASOUND TECHNIQUE: Gray-scale sonography with graded compression, as well as color Doppler and duplex ultrasound were performed to evaluate the lower extremity deep venous systems from the level of the common femoral vein and including the common femoral, femoral, profunda femoral, popliteal and calf veins including the  posterior tibial, peroneal and gastrocnemius veins when visible. The superficial great saphenous vein was also interrogated. Spectral Doppler was utilized to evaluate flow at rest and with distal augmentation maneuvers in the common femoral, femoral and popliteal veins. COMPARISON:  None. FINDINGS: RIGHT LOWER EXTREMITY Common Femoral Vein: No evidence of thrombus. Normal compressibility, respiratory phasicity and response to augmentation. Saphenofemoral Junction: No evidence of thrombus. Normal compressibility and flow on color Doppler imaging. Profunda Femoral Vein: No evidence of thrombus. Normal compressibility and flow on color Doppler imaging. Femoral Vein: No evidence of thrombus. Normal compressibility, respiratory phasicity and response to augmentation. Popliteal Vein: No evidence of thrombus. Normal compressibility, respiratory phasicity and response to augmentation. Calf Veins: No evidence of thrombus. Normal compressibility and flow on color Doppler imaging. Superficial Great Saphenous Vein: No evidence of thrombus. Normal compressibility. Venous Reflux:  None. Other Findings: Small fluid collection in the right popliteal fossa measuring up to 1.4 cm, likely Baker's cyst. LEFT LOWER EXTREMITY Common Femoral Vein: No evidence of thrombus. Normal compressibility, respiratory phasicity and response to augmentation. Saphenofemoral Junction: No evidence of thrombus. Normal compressibility and flow on color Doppler imaging. Profunda Femoral Vein: No evidence of thrombus. Normal compressibility and flow on color Doppler imaging. Femoral Vein: No evidence of thrombus. Normal compressibility, respiratory phasicity and response to augmentation. Popliteal Vein: No evidence of thrombus. Normal compressibility, respiratory phasicity and response to augmentation. Calf Veins: No evidence of thrombus. Normal compressibility and flow on color Doppler imaging. Superficial Great Saphenous Vein: No evidence of thrombus.  Normal compressibility. Venous Reflux:  None. Other Findings: Small fluid collection within the left popliteal fossa measuring up to 1.8 cm, likely Baker's cyst. IMPRESSION: 1. No evidence of deep venous thrombosis in either lower extremity. 2. Small bilateral popliteal fossa fluid collections, likely representing Baker's cysts. Electronically Signed   By: Davina Poke D.O.   On: 12/30/2019 12:15    Scheduled Meds: . ascorbic acid  1,000 mg Oral Daily  . baricitinib  4 mg Oral Daily  . insulin aspart  0-15 Units Subcutaneous TID WC  . insulin aspart  0-5 Units Subcutaneous QHS  . Ipratropium-Albuterol  2 puff Inhalation QID  . pneumococcal 23 valent vaccine  0.5 mL Intramuscular Tomorrow-1000  . polyethylene glycol  17 g Oral Daily  . predniSONE  50 mg Oral Daily  . Rivaroxaban  15 mg Oral BID  WC   Followed by  . [START ON 01/21/2020] rivaroxaban  20 mg Oral Q supper  . zinc sulfate  220 mg Oral Daily   Continuous Infusions: . sodium chloride Stopped (12/31/19 1233)  . azithromycin Stopped (12/31/19 1232)  . cefTRIAXone (ROCEPHIN)  IV Stopped (12/31/19 1112)  . remdesivir 100 mg in NS 100 mL 100 mg (01/01/20 0836)     LOS: 3 days   Time spent: 35 minutes.  Lorella Nimrod, MD [facility]  If 7PM-7AM, please contact night-coverage [email or web address]  01/01/2020, 8:41 AM   This record has been created using Systems analyst. Errors have been sought and corrected,but may not always be located. Such creation errors do not reflect on the standard of care.

## 2020-01-02 LAB — CBC WITH DIFFERENTIAL/PLATELET
Abs Immature Granulocytes: 0.22 10*3/uL — ABNORMAL HIGH (ref 0.00–0.07)
Basophils Absolute: 0 10*3/uL (ref 0.0–0.1)
Basophils Relative: 0 %
Eosinophils Absolute: 0 10*3/uL (ref 0.0–0.5)
Eosinophils Relative: 0 %
HCT: 41 % (ref 39.0–52.0)
Hemoglobin: 14.2 g/dL (ref 13.0–17.0)
Immature Granulocytes: 2 %
Lymphocytes Relative: 3 %
Lymphs Abs: 0.3 10*3/uL — ABNORMAL LOW (ref 0.7–4.0)
MCH: 29.9 pg (ref 26.0–34.0)
MCHC: 34.6 g/dL (ref 30.0–36.0)
MCV: 86.3 fL (ref 80.0–100.0)
Monocytes Absolute: 0.2 10*3/uL (ref 0.1–1.0)
Monocytes Relative: 1 %
Neutro Abs: 12.5 10*3/uL — ABNORMAL HIGH (ref 1.7–7.7)
Neutrophils Relative %: 94 %
Platelets: 282 10*3/uL (ref 150–400)
RBC: 4.75 MIL/uL (ref 4.22–5.81)
RDW: 13.5 % (ref 11.5–15.5)
WBC: 13.2 10*3/uL — ABNORMAL HIGH (ref 4.0–10.5)
nRBC: 0 % (ref 0.0–0.2)

## 2020-01-02 LAB — COMPREHENSIVE METABOLIC PANEL
ALT: 61 U/L — ABNORMAL HIGH (ref 0–44)
AST: 43 U/L — ABNORMAL HIGH (ref 15–41)
Albumin: 2.4 g/dL — ABNORMAL LOW (ref 3.5–5.0)
Alkaline Phosphatase: 62 U/L (ref 38–126)
Anion gap: 9 (ref 5–15)
BUN: 27 mg/dL — ABNORMAL HIGH (ref 8–23)
CO2: 23 mmol/L (ref 22–32)
Calcium: 7.9 mg/dL — ABNORMAL LOW (ref 8.9–10.3)
Chloride: 105 mmol/L (ref 98–111)
Creatinine, Ser: 0.81 mg/dL (ref 0.61–1.24)
GFR calc Af Amer: >60 mL/min (ref >60)
GFR calc non Af Amer: >60 mL/min (ref >60)
Glucose, Bld: 117 mg/dL — ABNORMAL HIGH (ref 70–99)
Potassium: 4.8 mmol/L (ref 3.5–5.1)
Sodium: 137 mmol/L (ref 135–145)
Total Bilirubin: 1.1 mg/dL (ref 0.3–1.2)
Total Protein: 5.9 g/dL — ABNORMAL LOW (ref 6.5–8.1)

## 2020-01-02 LAB — GLUCOSE, CAPILLARY
Glucose-Capillary: 95 mg/dL (ref 70–99)
Glucose-Capillary: 97 mg/dL (ref 70–99)
Glucose-Capillary: 137 mg/dL — ABNORMAL HIGH (ref 70–99)
Glucose-Capillary: 110 mg/dL — ABNORMAL HIGH (ref 70–99)

## 2020-01-02 LAB — C-REACTIVE PROTEIN: CRP: 12.4 mg/dL — ABNORMAL HIGH (ref <1.0)

## 2020-01-02 LAB — FERRITIN: Ferritin: 1591 ng/mL — ABNORMAL HIGH (ref 24–336)

## 2020-01-02 LAB — FIBRIN DERIVATIVES D-DIMER (ARMC ONLY): Fibrin derivatives D-dimer (ARMC): >7500 ng/mL (FEU) — ABNORMAL HIGH (ref 0.00–499.00)

## 2020-01-02 MED ORDER — CEFUROXIME AXETIL 500 MG PO TABS
500.0000 mg | ORAL_TABLET | Freq: Two times a day (BID) | ORAL | Status: AC
  Administered 2020-01-02 – 2020-01-04 (×6): 500 mg via ORAL
  Filled 2020-01-02 (×6): qty 1

## 2020-01-02 NOTE — Progress Notes (Signed)
PROGRESS NOTE    Brett Maldonado  CWC:[id number] DOB: 1951/04/21 DOA: 12/29/2019 PCP: System, Pcp Not In   Brief Narrative: Taken from H&P. Brett Maldonado is a 69 y.o. M with no significant PMHx who presents with 1 week cough and COVID exposure.Pt. is unvaccinated. Admitted for COVID-19 pneumonia.  Requiring up to 15L of O2.  Subjective: Patient still needing 15L, although able to maintain around 90% without NB.  Assessment & Plan:   Principal Problem:   COVID-19 Active Problems:   Transaminitis   Hyponatremia   Essential hypertension   Acute on chronic respiratory failure with hypoxia (HCC)   Pneumonia due to COVID-19 virus  Acute hypoxic respiratory failure secondary to COVID-19 pneumonia. Continue to require up to 15 L of oxygen with HFNC along with nonrebreather. Elevated inflammatory markers. D-dimer remains above 7500. Some improvement in CRP which was 9.2 today. Peaked at 20.8. CTA  with subsegmental lower lobes emboli. Lower extremity venous Doppler was negative for DVT. Procalcitonin elevated at 0.52>>0.25 -Continue remdesivir and Solu-Medrol-day 5. -Continue baricitinib-day 4-we will continue baricitinib at this time although there is some leukocytosis.  If that continue to get worse we might have to discontinue baricitinib.  Patient is also on steroid which can cause leukocytosis.  Procalcitonin improving at this time. -Continue ceftriaxone and Zithromax for possible superadded bacterial infection due to elevated procalcitonin. -Continue supportive care and supplement. -Continue supplemental oxygen to keep the saturation above 90%.  Pulmonary embolism. Most likely secondary to COVID-19 infection. Patient was on therapeutic Lovenox, switched to Xarelto on 12/31/19. -Continue Xarelto.  Hyperglycemia. A1c of 6.1. No prior diagnosis of diabetes. Patient is on steroid. -Continue sliding scale.  Constipation. -Add bowel regimen with MiraLAX daily.  Objective: Vitals:    01/01/20 2358 01/02/20 0500 01/02/20 0734 01/02/20 0811  BP: (!) 115/57 (!) 115/47 (!) 136/54   Pulse: 71 66 82 62  Resp: 18 20 20 18   Temp: 98.3 F (36.8 C) 98.1 F (36.7 C) 98.4 F (36.9 C)   TempSrc: Oral  Oral   SpO2: 92% 98% 94% 90%  Weight:      Height:        Intake/Output Summary (Last 24 hours) at 01/02/2020 0852 Last data filed at 01/02/2020 0800 Gross per 24 hour  Intake 480 ml  Output 1725 ml  Net -1245 ml   Filed Weights   12/29/19 1417  Weight: 77.1 kg    Examination:  General. Well developed man, In no acute distress. Pulmonary. Some scattered wheeze, normal respiratory effort. CV.  Regular rate and rhythm, no JVD, rub or murmur. Abdomen.  Soft, nontender, nondistended, BS positive. CNS.  Alert and oriented x3.  No focal neurologic deficit. Extremities.  No edema, no cyanosis, pulses intact and symmetrical. Psychiatry.  Judgment and insight appears normal.  DVT prophylaxis: Xarelto Code Status: Full Family Communication: Discussed with patient. Disposition Plan:  Status is: Inpatient  Remains inpatient appropriate because:Inpatient level of care appropriate due to severity of illness   Dispo: The patient is from: Home              Anticipated d/c is to: Home              Anticipated d/c date is: >3 days.              Patient currently is not medically stable to d/c.   Consultants:   Procedures:  Antimicrobials:  Rocephin Azithromax  Data Reviewed: I have personally reviewed following labs and imaging  studies  CBC: Recent Labs  Lab 12/29/19 1419 12/30/19 0624 12/31/19 0501 01/01/20 0458 01/02/20 0551  WBC 15.9* 8.7 11.8* 12.1* 13.2*  NEUTROABS 14.7* 7.7 10.6* 11.3* 12.5*  HGB 14.7 13.7 13.3 13.4 14.2  HCT 40.2 38.0* 38.1* 39.5 41.0  MCV 81.5 83.0 85.2 87.0 86.3  PLT 438* 276 341 350 710   Basic Metabolic Panel: Recent Labs  Lab 12/29/19 1419 12/30/19 0624 12/31/19 0501 01/01/20 0458 01/02/20 0551  NA 127* 134* 136 137 137   K 4.0 4.7 5.0 5.0 4.8  CL 95* 100 103 103 105  CO2 17* 25 25 25 23   GLUCOSE 182* 149* 154* 127* 117*  BUN 25* 25* 35* 33* 27*  CREATININE 1.09 0.97 0.98 0.87 0.81  CALCIUM 8.1* 8.0* 8.0* 8.0* 7.9*   GFR: Estimated Creatinine Clearance: 89.7 mL/min (by C-G formula based on SCr of 0.81 mg/dL). Liver Function Tests: Recent Labs  Lab 12/29/19 1419 12/30/19 0624 12/31/19 0501 01/01/20 0458 01/02/20 0551  AST 80* 41 39 38 43*  ALT 102* 76* 69* 62* 61*  ALKPHOS 76 62 61 64 62  BILITOT 1.0 0.8 1.0 1.0 1.1  PROT 6.6 6.0* 5.7* 5.9* 5.9*  ALBUMIN 2.8* 2.4* 2.3* 2.4* 2.4*   No results for input(s): LIPASE, AMYLASE in the last 168 hours. No results for input(s): AMMONIA in the last 168 hours. Coagulation Profile: No results for input(s): INR, PROTIME in the last 168 hours. Cardiac Enzymes: No results for input(s): CKTOTAL, CKMB, CKMBINDEX, TROPONINI in the last 168 hours. BNP (last 3 results) No results for input(s): PROBNP in the last 8760 hours. HbA1C: Recent Labs    12/30/19 1329  HGBA1C 6.1*   CBG: Recent Labs  Lab 01/01/20 0746 01/01/20 1146 01/01/20 1613 01/01/20 2124 01/02/20 0733  GLUCAP 94 156* 97 100* 95   Lipid Profile: No results for input(s): CHOL, HDL, LDLCALC, TRIG, CHOLHDL, LDLDIRECT in the last 72 hours. Thyroid Function Tests: No results for input(s): TSH, T4TOTAL, FREET4, T3FREE, THYROIDAB in the last 72 hours. Anemia Panel: Recent Labs    01/01/20 0458 01/02/20 0551  FERRITIN 1,279* 1,591*   Sepsis Labs: Recent Labs  Lab 12/29/19 1419 12/29/19 1533 12/31/19 0501 01/01/20 0458  PROCALCITON 0.77  --  0.52 0.25  LATICACIDVEN  --  3.6*  --   --     Recent Results (from the past 240 hour(s))  Blood Culture (routine x 2)     Status: None (Preliminary result)   Collection Time: 12/29/19  2:19 PM   Specimen: BLOOD  Result Value Ref Range Status   Specimen Description BLOOD LEFT ANTECUBITAL  Final   Special Requests   Final    BOTTLES  DRAWN AEROBIC AND ANAEROBIC Blood Culture adequate volume   Culture   Final    NO GROWTH 4 DAYS Performed at [facility], [address], [place], [place] [address]    Report Status PENDING  Incomplete  Blood Culture (routine x 2)     Status: None (Preliminary result)   Collection Time: 12/29/19  2:19 PM   Specimen: BLOOD  Result Value Ref Range Status   Specimen Description BLOOD RIGHT ANTECUBITAL  Final   Special Requests   Final    BOTTLES DRAWN AEROBIC AND ANAEROBIC Blood Culture adequate volume   Culture   Final    NO GROWTH 4 DAYS Performed at [facility], [address], [place],  [address]    Report Status PENDING  Incomplete  SARS Coronavirus 2 by  RT PCR (hospital order, performed in [facility] hospital lab) Nasopharyngeal Nasopharyngeal Swab     Status: Abnormal   Collection Time: 12/29/19  2:20 PM   Specimen: Nasopharyngeal Swab  Result Value Ref Range Status   SARS Coronavirus 2 POSITIVE (A) NEGATIVE Final    Comment: RESULT CALLED TO, READ BACK BY AND VERIFIED WITH: KATIE FERGUSSON @1606  12/29/19 MJU (NOTE) SARS-CoV-2 target nucleic acids are DETECTED  SARS-CoV-2 RNA is generally detectable in upper respiratory specimens  during the acute phase of infection.  Positive results are indicative  of the presence of the identified virus, but do not rule out bacterial infection or co-infection with other pathogens not detected by the test.  Clinical correlation with patient history and  other diagnostic information is necessary to determine patient infection status.  The expected result is negative.  Fact Sheet for Patients:   StrictlyIdeas.no   Fact Sheet for Healthcare Providers:   BankingDealers.co.za    This test is not yet approved or cleared by the [place] FDA and  has been authorized for detection and/or diagnosis of SARS-CoV-2 by FDA under an Emergency Use Authorization (EUA).   This EUA will remain in effect (meaning this test  can be used) for the duration of  the COVID-19 declaration under Section 564(b)(1) of the Act, 21 U.S.C. section 360-bbb-3(b)(1), unless the authorization is terminated or revoked sooner.  Performed at [facility], [address], [place], [place] [address]      Radiology Studies: No results found.  Scheduled Meds: . ascorbic acid  1,000 mg Oral Daily  . baricitinib  4 mg Oral Daily  . fluticasone  1 spray Each Nare Daily  . insulin aspart  0-15 Units Subcutaneous TID WC  . insulin aspart  0-5 Units Subcutaneous QHS  . Ipratropium-Albuterol  2 puff Inhalation QID  . pneumococcal 23 valent vaccine  0.5 mL Intramuscular Tomorrow-1000  . polyethylene glycol  17 g Oral Daily  . predniSONE  50 mg Oral Daily  . Rivaroxaban  15 mg Oral BID WC   Followed by  . [START ON 01/21/2020] rivaroxaban  20 mg Oral Q supper  . zinc sulfate  220 mg Oral Daily   Continuous Infusions: . sodium chloride Stopped (12/31/19 1233)  . cefTRIAXone (ROCEPHIN)  IV    . remdesivir 100 mg in NS 100 mL 100 mg (01/01/20 0836)     LOS: 4 days   Time spent: 35 minutes.  Lorella Nimrod, MD [facility]  If 7PM-7AM, please contact night-coverage [email or web address]  01/02/2020, 8:52 AM   This record has been created using Systems analyst. Errors have been sought and corrected,but may not always be located. Such creation errors do not reflect on the standard of care.

## 2020-01-02 NOTE — Consult Note (Addendum)
[place] for Transition to Xarelto from Lovenox Indication: pulmonary embolus  No Known Allergies  Patient Measurements: Height: 5\' 9"  (175.3 cm) Weight: 77.1 kg (170 lb) IBW/kg (Calculated) : 70.7 Heparin Dosing Weight: 77.1 kg   Vital Signs: Temp: 98.4 F (36.9 C) (09/10 0734) Temp Source: Oral (09/10 0734) BP: 136/54 (09/10 0734) Pulse Rate: 62 (09/10 0811)  Labs: Recent Labs    12/31/19 0501 12/31/19 0501 01/01/20 0458 01/02/20 0551  HGB 13.3   < > 13.4 14.2  HCT 38.1*  --  39.5 41.0  PLT 341  --  350 282  CREATININE 0.98  --  0.87 0.81   < > = values in this interval not displayed.    Estimated Creatinine Clearance: 89.7 mL/min (by C-G formula based on SCr of 0.81 mg/dL).   Medications:  No PTA anticoagulants   Assessment: 69 year old male admitted 12/29/19 with 1 week history of cough and COVID exposure. Pt has no significant PMH. Pt is COVID positive. D-Dimer > 7500 - pt found to have PE on CT chest. Pt was started on Lovenox for PE - pharmacy now consulted to transition to [place].    Goal of Therapy:  Monitor platelets by anticoagulation protocol: Yes   Plan:  Lovenox 1 mg/kg Q12H - will start now - Continue Xarelto15 mg BID until 9/29 when pt will begin taking 20 mg daily - This pharmacist called the patient's called the patient's room twice to discuss medication counseling of Xarelto and patient did not answer the phone - kept ringing. Yesterday, a Xarelto patient education handout was given to the patient.   -Pharmacy will follow CBC and Scr every three days, per protocol  --Update @ 1500: I have tried calling the patient two additional times. I also called the nurse Caryl Pina) to see if the patient is near his phone and she said she would check. --Update @ 1515: Called patient's room.  --Update @ 1529: Called patient's room 2x.   Rowland Lathe 01/02/2020,9:00 AM

## 2020-01-03 LAB — CBC WITH DIFFERENTIAL/PLATELET
Abs Immature Granulocytes: 0.22 10*3/uL — ABNORMAL HIGH (ref 0.00–0.07)
Basophils Absolute: 0.1 10*3/uL (ref 0.0–0.1)
Basophils Relative: 0 %
Eosinophils Absolute: 0.2 10*3/uL (ref 0.0–0.5)
Eosinophils Relative: 1 %
HCT: 44.4 % (ref 39.0–52.0)
Hemoglobin: 15.3 g/dL (ref 13.0–17.0)
Immature Granulocytes: 1 %
Lymphocytes Relative: 2 %
Lymphs Abs: 0.3 10*3/uL — ABNORMAL LOW (ref 0.7–4.0)
MCH: 29.4 pg (ref 26.0–34.0)
MCHC: 34.5 g/dL (ref 30.0–36.0)
MCV: 85.4 fL (ref 80.0–100.0)
Monocytes Absolute: 0.2 10*3/uL (ref 0.1–1.0)
Monocytes Relative: 1 %
Neutro Abs: 19.3 10*3/uL — ABNORMAL HIGH (ref 1.7–7.7)
Neutrophils Relative %: 95 %
Platelets: 231 10*3/uL (ref 150–400)
RBC: 5.2 MIL/uL (ref 4.22–5.81)
RDW: 13.4 % (ref 11.5–15.5)
WBC: 20.4 10*3/uL — ABNORMAL HIGH (ref 4.0–10.5)
nRBC: 0 % (ref 0.0–0.2)

## 2020-01-03 LAB — COMPREHENSIVE METABOLIC PANEL
ALT: 55 U/L — ABNORMAL HIGH (ref 0–44)
AST: 37 U/L (ref 15–41)
Albumin: 2.5 g/dL — ABNORMAL LOW (ref 3.5–5.0)
Alkaline Phosphatase: 65 U/L (ref 38–126)
Anion gap: 12 (ref 5–15)
BUN: 27 mg/dL — ABNORMAL HIGH (ref 8–23)
CO2: 22 mmol/L (ref 22–32)
Calcium: 8.1 mg/dL — ABNORMAL LOW (ref 8.9–10.3)
Chloride: 100 mmol/L (ref 98–111)
Creatinine, Ser: 0.93 mg/dL (ref 0.61–1.24)
GFR calc Af Amer: >60 mL/min (ref >60)
GFR calc non Af Amer: >60 mL/min (ref >60)
Glucose, Bld: 101 mg/dL — ABNORMAL HIGH (ref 70–99)
Potassium: 4.8 mmol/L (ref 3.5–5.1)
Sodium: 134 mmol/L — ABNORMAL LOW (ref 135–145)
Total Bilirubin: 1.4 mg/dL — ABNORMAL HIGH (ref 0.3–1.2)
Total Protein: 6.4 g/dL — ABNORMAL LOW (ref 6.5–8.1)

## 2020-01-03 LAB — GLUCOSE, CAPILLARY
Glucose-Capillary: 104 mg/dL — ABNORMAL HIGH (ref 70–99)
Glucose-Capillary: 136 mg/dL — ABNORMAL HIGH (ref 70–99)
Glucose-Capillary: 140 mg/dL — ABNORMAL HIGH (ref 70–99)
Glucose-Capillary: 124 mg/dL — ABNORMAL HIGH (ref 70–99)

## 2020-01-03 LAB — CULTURE, BLOOD (ROUTINE X 2)
Culture: NO GROWTH
Culture: NO GROWTH
Special Requests: ADEQUATE
Special Requests: ADEQUATE

## 2020-01-03 LAB — C-REACTIVE PROTEIN: CRP: 19.3 mg/dL — ABNORMAL HIGH (ref <1.0)

## 2020-01-03 LAB — FIBRIN DERIVATIVES D-DIMER (ARMC ONLY): Fibrin derivatives D-dimer (ARMC): >7500 ng/mL (FEU) — ABNORMAL HIGH (ref 0.00–499.00)

## 2020-01-03 LAB — FERRITIN: Ferritin: 2675 ng/mL — ABNORMAL HIGH (ref 24–336)

## 2020-01-03 LAB — PROCALCITONIN: Procalcitonin: <0.1 ng/mL

## 2020-01-03 NOTE — Progress Notes (Addendum)
PROGRESS NOTE    Brett Maldonado  ULA:[id number] DOB: 1950/10/29 DOA: 12/29/2019 PCP: System, Pcp Not In   Brief Narrative: Taken from H&P. Brett Maldonado is a 69 y.o. M with no significant PMHx who presents with 1 week cough and COVID exposure.Pt. is unvaccinated. Admitted for COVID-19 pneumonia.  Requiring up to 15L of O2.  Subjective: Pt has no new complaints today.Saturating mid 90es on 15, decreased to 13 and remained above 90.  Assessment & Plan:   Principal Problem:   COVID-19 Active Problems:   Transaminitis   Hyponatremia   Essential hypertension   Acute on chronic respiratory failure with hypoxia (HCC)   Pneumonia due to COVID-19 virus  Acute hypoxic respiratory failure secondary to COVID-19 pneumonia. Show some improvement in oxygenation, able to wean him to 8L. Elevated inflammatory markers. CTA  with subsegmental lower lobes emboli. Lower extremity venous Doppler was negative for DVT. Procalcitonin elevated at 0.52>>0.25>><0.10 Completed a course of remdesivir. -Continue Solu-Medrol-day 6. -Discontinue baricitinib- due to worsening leukocytosis. -Continue Ceftin for another day. -Completed the course of Zithromax. -Continue supportive care and supplement. -Continue supplemental oxygen to keep the saturation above 90%. -PT evaluation  Pulmonary embolism. Most likely secondary to COVID-19 infection. Patient was on therapeutic Lovenox, switched to Xarelto on 12/31/19. -Continue Xarelto.  Hyperglycemia. A1c of 6.1. No prior diagnosis of diabetes. Patient is on steroid. -Continue sliding scale.  Constipation. -Continue bowel regimen with MiraLAX daily.  Objective: Vitals:   01/02/20 2048 01/02/20 2332 01/03/20 0327 01/03/20 0738  BP: 129/74  116/81 (!) 115/51  Pulse: 91  98 95  Resp: (!) 22  (!) 24 (!) 25  Temp: 98.3 F (36.8 C)  98.6 F (37 C) 98.4 F (36.9 C)  TempSrc:   Oral Oral  SpO2: 92% 96% 94% 94%  Weight:      Height:        Intake/Output  Summary (Last 24 hours) at 01/03/2020 0756 Last data filed at 01/03/2020 0433 Gross per 24 hour  Intake 120 ml  Output 1850 ml  Net -1730 ml   Filed Weights   12/29/19 1417  Weight: 77.1 kg    Examination:  General.  Well-developed gentleman, in no acute distress. Pulmonary. Few dry crackles B/L,  normal respiratory effort. CV.  Regular rate and rhythm, no JVD, rub or murmur. Abdomen.  Soft, nontender, nondistended, BS positive. CNS.  Alert and oriented x3.  No focal neurologic deficit. Extremities.  No edema, no cyanosis, pulses intact and symmetrical. Psychiatry.  Judgment and insight appears normal.  DVT prophylaxis: Xarelto Code Status: Full Family Communication: Wife was updated on phone. Disposition Plan:  Status is: Inpatient  Remains inpatient appropriate because:Inpatient level of care appropriate due to severity of illness   Dispo: The patient is from: Home              Anticipated d/c is to: Home              Anticipated d/c date is: 3 days.              Patient currently is not medically stable to d/c.  Patient started showing some response today.  Can be discharged home if maintaining saturation above 90% up to 6 L.   Consultants:   Procedures:  Antimicrobials:  Ceftin  Data Reviewed: I have personally reviewed following labs and imaging studies  CBC: Recent Labs  Lab 12/29/19 1419 12/30/19 0624 12/31/19 0501 01/01/20 0458 01/02/20 0551  WBC 15.9* 8.7 11.8* 12.1*  13.2*  NEUTROABS 14.7* 7.7 10.6* 11.3* 12.5*  HGB 14.7 13.7 13.3 13.4 14.2  HCT 40.2 38.0* 38.1* 39.5 41.0  MCV 81.5 83.0 85.2 87.0 86.3  PLT 438* 276 341 350 242   Basic Metabolic Panel: Recent Labs  Lab 12/29/19 1419 12/30/19 0624 12/31/19 0501 01/01/20 0458 01/02/20 0551  NA 127* 134* 136 137 137  K 4.0 4.7 5.0 5.0 4.8  CL 95* 100 103 103 105  CO2 17* 25 25 25 23   GLUCOSE 182* 149* 154* 127* 117*  BUN 25* 25* 35* 33* 27*  CREATININE 1.09 0.97 0.98 0.87 0.81  CALCIUM  8.1* 8.0* 8.0* 8.0* 7.9*   GFR: Estimated Creatinine Clearance: 89.7 mL/min (by C-G formula based on SCr of 0.81 mg/dL). Liver Function Tests: Recent Labs  Lab 12/29/19 1419 12/30/19 0624 12/31/19 0501 01/01/20 0458 01/02/20 0551  AST 80* 41 39 38 43*  ALT 102* 76* 69* 62* 61*  ALKPHOS 76 62 61 64 62  BILITOT 1.0 0.8 1.0 1.0 1.1  PROT 6.6 6.0* 5.7* 5.9* 5.9*  ALBUMIN 2.8* 2.4* 2.3* 2.4* 2.4*   No results for input(s): LIPASE, AMYLASE in the last 168 hours. No results for input(s): AMMONIA in the last 168 hours. Coagulation Profile: No results for input(s): INR, PROTIME in the last 168 hours. Cardiac Enzymes: No results for input(s): CKTOTAL, CKMB, CKMBINDEX, TROPONINI in the last 168 hours. BNP (last 3 results) No results for input(s): PROBNP in the last 8760 hours. HbA1C: No results for input(s): HGBA1C in the last 72 hours. CBG: Recent Labs  Lab 01/02/20 0733 01/02/20 1205 01/02/20 1630 01/02/20 2043 01/03/20 0740  GLUCAP 95 97 137* 110* 104*   Lipid Profile: No results for input(s): CHOL, HDL, LDLCALC, TRIG, CHOLHDL, LDLDIRECT in the last 72 hours. Thyroid Function Tests: No results for input(s): TSH, T4TOTAL, FREET4, T3FREE, THYROIDAB in the last 72 hours. Anemia Panel: Recent Labs    01/01/20 0458 01/02/20 0551  FERRITIN 1,279* 1,591*   Sepsis Labs: Recent Labs  Lab 12/29/19 1419 12/29/19 1533 12/31/19 0501 01/01/20 0458  PROCALCITON 0.77  --  0.52 0.25  LATICACIDVEN  --  3.6*  --   --     Recent Results (from the past 240 hour(s))  Blood Culture (routine x 2)     Status: None   Collection Time: 12/29/19  2:19 PM   Specimen: BLOOD  Result Value Ref Range Status   Specimen Description BLOOD LEFT ANTECUBITAL  Final   Special Requests   Final    BOTTLES DRAWN AEROBIC AND ANAEROBIC Blood Culture adequate volume   Culture   Final    NO GROWTH 5 DAYS Performed at [facility], [address] [place], [place] [address]    Report Status  01/03/2020 FINAL  Final  Blood Culture (routine x 2)     Status: None   Collection Time: 12/29/19  2:19 PM   Specimen: BLOOD  Result Value Ref Range Status   Specimen Description BLOOD RIGHT ANTECUBITAL  Final   Special Requests   Final    BOTTLES DRAWN AEROBIC AND ANAEROBIC Blood Culture adequate volume   Culture   Final    NO GROWTH 5 DAYS Performed at [facility], [address] [place], [place] [address]    Report Status 01/03/2020 FINAL  Final  SARS Coronavirus 2 by RT PCR (hospital order, performed in [facility] hospital lab) Nasopharyngeal Nasopharyngeal Swab     Status: Abnormal   Collection Time: 12/29/19  2:20 PM  Specimen: Nasopharyngeal Swab  Result Value Ref Range Status   SARS Coronavirus 2 POSITIVE (A) NEGATIVE Final    Comment: RESULT CALLED TO, READ BACK BY AND VERIFIED WITH: KATIE FERGUSSON @1606  12/29/19 MJU (NOTE) SARS-CoV-2 target nucleic acids are DETECTED  SARS-CoV-2 RNA is generally detectable in upper respiratory specimens  during the acute phase of infection.  Positive results are indicative  of the presence of the identified virus, but do not rule out bacterial infection or co-infection with other pathogens not detected by the test.  Clinical correlation with patient history and  other diagnostic information is necessary to determine patient infection status.  The expected result is negative.  Fact Sheet for Patients:   StrictlyIdeas.no   Fact Sheet for Healthcare Providers:   BankingDealers.co.za    This test is not yet approved or cleared by the [place] FDA and  has been authorized for detection and/or diagnosis of SARS-CoV-2 by FDA under an Emergency Use Authorization (EUA).  This EUA will remain in effect (meaning this test  can be used) for the duration of  the COVID-19 declaration under Section 564(b)(1) of the Act, 21 U.S.C. section 360-bbb-3(b)(1), unless the authorization  is terminated or revoked sooner.  Performed at [facility], [address], [place],  [address]      Radiology Studies: No results found.  Scheduled Meds: . ascorbic acid  1,000 mg Oral Daily  . baricitinib  4 mg Oral Daily  . cefUROXime  500 mg Oral BID WC  . fluticasone  1 spray Each Nare Daily  . insulin aspart  0-15 Units Subcutaneous TID WC  . insulin aspart  0-5 Units Subcutaneous QHS  . Ipratropium-Albuterol  2 puff Inhalation QID  . pneumococcal 23 valent vaccine  0.5 mL Intramuscular Tomorrow-1000  . polyethylene glycol  17 g Oral Daily  . predniSONE  50 mg Oral Daily  . Rivaroxaban  15 mg Oral BID WC   Followed by  . [START ON 01/21/2020] rivaroxaban  20 mg Oral Q supper  . zinc sulfate  220 mg Oral Daily   Continuous Infusions: . sodium chloride Stopped (12/31/19 1233)     LOS: 5 days   Time spent: 30 minutes.  Lorella Nimrod, MD [facility]  If 7PM-7AM, please contact night-coverage [email or web address]  01/03/2020, 7:56 AM   This record has been created using Systems analyst. Errors have been sought and corrected,but may not always be located. Such creation errors do not reflect on the standard of care.

## 2020-01-04 LAB — CBC
HCT: 42.9 % (ref 39.0–52.0)
Hemoglobin: 15.3 g/dL (ref 13.0–17.0)
MCH: 30.6 pg (ref 26.0–34.0)
MCHC: 35.7 g/dL (ref 30.0–36.0)
MCV: 85.8 fL (ref 80.0–100.0)
Platelets: 240 10*3/uL (ref 150–400)
RBC: 5 MIL/uL (ref 4.22–5.81)
RDW: 13.6 % (ref 11.5–15.5)
WBC: 19.4 10*3/uL — ABNORMAL HIGH (ref 4.0–10.5)
nRBC: 0 % (ref 0.0–0.2)

## 2020-01-04 LAB — BASIC METABOLIC PANEL
Anion gap: 9 (ref 5–15)
BUN: 28 mg/dL — ABNORMAL HIGH (ref 8–23)
CO2: 23 mmol/L (ref 22–32)
Calcium: 8.2 mg/dL — ABNORMAL LOW (ref 8.9–10.3)
Chloride: 102 mmol/L (ref 98–111)
Creatinine, Ser: 0.82 mg/dL (ref 0.61–1.24)
GFR calc Af Amer: >60 mL/min (ref >60)
GFR calc non Af Amer: >60 mL/min (ref >60)
Glucose, Bld: 104 mg/dL — ABNORMAL HIGH (ref 70–99)
Potassium: 4.5 mmol/L (ref 3.5–5.1)
Sodium: 134 mmol/L — ABNORMAL LOW (ref 135–145)

## 2020-01-04 LAB — GLUCOSE, CAPILLARY
Glucose-Capillary: 105 mg/dL — ABNORMAL HIGH (ref 70–99)
Glucose-Capillary: 178 mg/dL — ABNORMAL HIGH (ref 70–99)
Glucose-Capillary: 116 mg/dL — ABNORMAL HIGH (ref 70–99)
Glucose-Capillary: 115 mg/dL — ABNORMAL HIGH (ref 70–99)

## 2020-01-04 LAB — C-REACTIVE PROTEIN: CRP: 17.7 mg/dL — ABNORMAL HIGH (ref <1.0)

## 2020-01-04 LAB — FIBRIN DERIVATIVES D-DIMER (ARMC ONLY): Fibrin derivatives D-dimer (ARMC): >7500 ng/mL (FEU) — ABNORMAL HIGH (ref 0.00–499.00)

## 2020-01-04 LAB — PROCALCITONIN: Procalcitonin: 0.15 ng/mL

## 2020-01-04 NOTE — Evaluation (Signed)
Physical Therapy Evaluation Patient Details Name: Brett Maldonado MRN: [id number] DOB: 30-Mar-1951 Today's Date: 01/04/2020   History of Present Illness  Patient is a 69 year old male admitted with COVID 19, transaminitis, hponatremia, HTN, pneumonia. Patient's wife developed flu like symptoms, shortly after patient developed dry cough, aches, malaise and fatigue. In ER he was dyspneic saturatign 68% on room air with positive COVID x ray showed bilateral opacities. PMH includes basa cell carcinoma and HTN. Patient walks unassisted and at baseline does not use oxygen. New diagnosis of PE    Clinical Impression  Patient is a very pleasant 69 year old male who presents with diffuse weakness and limited capacity for mobility secondary to COVID. Patient is very eager to participate in therapy as he is ind at baseline without AD. Patient lives in a two story home but is able to live on bottom level with his wife and daughter who is a Marine scientist to aide upon d/c. Patient is in bed upon PT arrival on nasal cannula with Spo2 in the 90's. Patient able to transition to EOB with mod I for extra time due to fatigue, Upon sitting EOB Sp02 dropped into the 70's requiring return to the hi flow set up, cueing for breathing allowed for Sp02 to return to 90's within 1-2 minutes. Once Sp02 is in functional range, seated strengthening interventions performed and sit to stand transfers with CGA. Patient requires frequent rest breaks due to fatigue between each intervention, declined ambulation today due to fatigue but tolerated static standing 2x 30 seconds w/o UE support. Patient returned to supine and given additional strengthening interventions to perform, able to perform without hi-flow on 6L of oxygen via nasal cannula without drop of Sp02 below 90. Patient's needs met, printout of exercises in supine given, and alarms set. Patient will benefit from skilled physical therapy to increase strength, capacity for functional mobility,  and stability. Patient will benefit from HHPT and intermittent aide upon discharge pending their ability to ambulate prior to discharge.     Follow Up Recommendations Home health PT;Supervision - Intermittent    Equipment Recommendations  3in1 (PT);Rolling walker with 5" wheels    Recommendations for Other Services       Precautions / Restrictions Precautions Precautions: Fall Restrictions Weight Bearing Restrictions: No      Mobility  Bed Mobility Overal bed mobility: Modified Independent             General bed mobility comments: requires additional time due to fatigue to get EOB, able to return to supine without assistance.  Transfers Overall transfer level: Needs assistance Equipment used: None Transfers: Sit to/from Stand Sit to Stand: Min guard         General transfer comment: CGA required for STS due to fatigue, SOB, requires hi flow oxygen to be re-placed back on for STS  Ambulation/Gait             General Gait Details: unable to tolerate at this time.  Stairs            Wheelchair Mobility    Modified Rankin (Stroke Patients Only)       Balance Overall balance assessment: Needs assistance Sitting-balance support: No upper extremity supported;Feet supported Sitting balance-Leahy Scale: Good Sitting balance - Comments: able to maintain once hi flow put back on returning Sp02 to functional level .   Standing balance support: No upper extremity supported Standing balance-Leahy Scale: Fair Standing balance comment: Able to static stand with CGA w/o UE support  30 seconds x2 trials                             Pertinent Vitals/Pain Pain Assessment: No/denies pain    Home Living Family/patient expects to be discharged to:: Private residence Living Arrangements: Spouse/significant other Available Help at Discharge: Family Type of Home: House Home Access: Stairs to enter   [employer] of Steps: 2 Home Layout:  Two level;Able to live on main level with bedroom/bathroom   Additional Comments: Patient lives at home with his wife, reports his daughter is a Marine scientist and can help as well. Can live on first floor when returns home. No equipment at home.    Prior Function Level of Independence: Independent         Comments: Patient is amb w/o AD at baseline.  Ind at baseline, works in Pensions consultant at [employer]     Hand Dominance        Extremity/Trunk Assessment   Upper Extremity Assessment Upper Extremity Assessment: Overall WFL for tasks assessed    Lower Extremity Assessment Lower Extremity Assessment: RLE deficits/detail;LLE deficits/detail RLE Deficits / Details: grossly 4-/5 RLE Sensation: WNL RLE Coordination: WNL LLE Deficits / Details: grossly 4-/5 LLE Sensation: WNL LLE Coordination: WNL    Cervical / Trunk Assessment Cervical / Trunk Assessment: Normal  Communication   Communication: No difficulties  Cognition Arousal/Alertness: Awake/alert Behavior During Therapy: WFL for tasks assessed/performed Overall Cognitive Status: Within Functional Limits for tasks assessed                                 General Comments: Patient alert and eager to participate in PT      General Comments General comments (skin integrity, edema, etc.): patient appears well nourished and groomed.    Exercises General Exercises - Lower Extremity Ankle Circles/Pumps: Strengthening;Both;15 reps;Supine Quad Sets: Strengthening;Both;5 reps;Supine Gluteal Sets: Strengthening;Both;10 reps;Supine Long Arc Quad: Strengthening;Both;10 reps;Seated Heel Slides: Strengthening;Both;5 reps;Supine Hip ABduction/ADduction: Strengthening;Both;5 reps;Supine Straight Leg Raises: Strengthening;Both;5 reps;Supine Hip Flexion/Marching: Strengthening;Both;5 reps;Seated Other Exercises Other Exercises: Patient educated on role of PT in acute care setting. STS transferred performed with CGA x 4  trials with seated rest breaks for return to therapuetic value of SP02, standing balance statically performed 30 seconds x 2 trials. Supine strengthening and seated interventions performed.   Assessment/Plan    PT Assessment Patient needs continued PT services  PT Problem List Decreased strength;Decreased activity tolerance;Decreased balance;Decreased mobility;Cardiopulmonary status limiting activity       PT Treatment Interventions DME instruction;Gait training;Stair training;Functional mobility training;Therapeutic activities;Patient/family education;Neuromuscular re-education;Balance training;Therapeutic exercise    PT Goals (Current goals can be found in the Care Plan section)  Acute Rehab PT Goals Patient Stated Goal: to return home PT Goal Formulation: With patient Time For Goal Achievement: 01/18/20 Potential to Achieve Goals: Fair    Frequency Min 2X/week   Barriers to discharge   patient will benefit from HHPT and aide pending ambulation prior to d/c    Co-evaluation               AM-PAC PT "6 Clicks" Mobility  Outcome Measure Help needed turning from your back to your side while in a flat bed without using bedrails?: None Help needed moving from lying on your back to sitting on the side of a flat bed without using bedrails?: A Little Help needed moving to and from a bed to  a chair (including a wheelchair)?: A Little Help needed standing up from a chair using your arms (e.g., wheelchair or bedside chair)?: A Little Help needed to walk in hospital room?: A Lot Help needed climbing 3-5 steps with a railing? : A Lot 6 Click Score: 17    End of Session Equipment Utilized During Treatment: Gait belt;Oxygen (initially on 6L, had to use hi-flow for seated/standing on 15L) Activity Tolerance: Patient tolerated treatment well;Patient limited by fatigue Patient left: in bed;with call bell/phone within reach;with bed alarm set Nurse Communication: Mobility status PT Visit  Diagnosis: Unsteadiness on feet (R26.81);Other abnormalities of gait and mobility (R26.89);Muscle weakness (generalized) (M62.81);Difficulty in walking, not elsewhere classified (R26.2)    Time: 0932-6712 PT Time Calculation (min) (ACUTE ONLY): 41 min   Charges:   PT Evaluation $PT Eval Moderate Complexity: 1 Mod PT Treatments $Therapeutic Exercise: 23-37 mins       Janna Arch, PT, DPT   01/04/2020, 3:49 PM

## 2020-01-04 NOTE — Progress Notes (Signed)
PROGRESS NOTE    Brett Maldonado  YWV:[id number] DOB: July 24, 1950 DOA: 12/29/2019 PCP: System, Pcp Not In   Brief Narrative: Taken from H&P. Brett Maldonado is a 69 y.o. M with no significant PMHx who presents with 1 week cough and COVID exposure.Pt. is unvaccinated. Admitted for COVID-19 pneumonia.  Requiring up to 15L of O2.  Subjective: Feeling SOB after using bathroom, saturation dropped to low 80's, before that saturating 93% on 6L. Increased to 15 temporarily. Will be able to wean soon.  Assessment & Plan:   Principal Problem:   COVID-19 Active Problems:   Transaminitis   Hyponatremia   Essential hypertension   Acute on chronic respiratory failure with hypoxia (HCC)   Pneumonia due to COVID-19 virus  Acute hypoxic respiratory failure secondary to COVID-19 pneumonia. Show some improvement in oxygenation, able to wean him to 6L, oxygen requirement increased again with some exertion. Elevated inflammatory markers. CTA  with subsegmental lower lobes emboli. Lower extremity venous Doppler was negative for DVT. Procalcitonin elevated at 0.52>>0.25>><0.10>>0.15 Patient was initially started on baricitinib which was discontinued after 6 days due to worsening leukocytosis. Completed a course of remdesivir. -Continue Solu-Medrol-day 7. -Continue Ceftin for another day. -Completed the course of Zithromax. -Continue supportive care and supplement. -Continue supplemental oxygen to keep the saturation above 90%. -PT evaluation  Pulmonary embolism. Most likely secondary to COVID-19 infection. Patient was on therapeutic Lovenox, switched to Xarelto on 12/31/19. -Continue Xarelto.  Hyperglycemia. A1c of 6.1. No prior diagnosis of diabetes. Patient is on steroid. -Continue sliding scale.  Constipation. -Continue bowel regimen with MiraLAX daily.  Objective: Vitals:   01/04/20 0038 01/04/20 0536 01/04/20 0736 01/04/20 0737  BP: 107/72 (!) 103/52 (!) 155/55 (!) 105/50  Pulse: 78 85 90    Resp: 16 18 18    Temp: 98 F (36.7 C) 98.3 F (36.8 C) 98.7 F (37.1 C)   TempSrc: Oral Oral Oral   SpO2: (!) 89% 93%    Weight:      Height:        Intake/Output Summary (Last 24 hours) at 01/04/2020 0826 Last data filed at 01/04/2020 0657 Gross per 24 hour  Intake --  Output 1075 ml  Net -1075 ml   Filed Weights   12/29/19 1417  Weight: 77.1 kg    Examination:  General.  Patient is in no acute distress, appears little SOB. Pulmonary.  Lungs clear bilaterally, normal respiratory effort. CV.  Regular rate and rhythm, no JVD, rub or murmur. Abdomen.  Soft, nontender, nondistended, BS positive. CNS.  Alert and oriented x3.  No focal neurologic deficit. Extremities.  No edema, no cyanosis, pulses intact and symmetrical. Psychiatry.  Judgment and insight appears normal.  DVT prophylaxis: Xarelto Code Status: Full Family Communication: Wife was updated on phone. Disposition Plan:  Status is: Inpatient  Remains inpatient appropriate because:Inpatient level of care appropriate due to severity of illness   Dispo: The patient is from: Home              Anticipated d/c is to: Home              Anticipated d/c date is: 3 days.              Patient currently is not medically stable to d/c.    Consultants:   Procedures:  Antimicrobials:  Ceftin  Data Reviewed: I have personally reviewed following labs and imaging studies  CBC: Recent Labs  Lab 12/30/19 0624 12/30/19 0624 12/31/19 0501 01/01/20 0458 01/02/20  9735 01/03/20 0817 01/04/20 0445  WBC 8.7   < > 11.8* 12.1* 13.2* 20.4* 19.4*  NEUTROABS 7.7  --  10.6* 11.3* 12.5* 19.3*  --   HGB 13.7   < > 13.3 13.4 14.2 15.3 15.3  HCT 38.0*   < > 38.1* 39.5 41.0 44.4 42.9  MCV 83.0   < > 85.2 87.0 86.3 85.4 85.8  PLT 276   < > 341 350 282 231 240   < > = values in this interval not displayed.   Basic Metabolic Panel: Recent Labs  Lab 12/31/19 0501 01/01/20 0458 01/02/20 0551 01/03/20 0817 01/04/20 0445  NA  136 137 137 134* 134*  K 5.0 5.0 4.8 4.8 4.5  CL 103 103 105 100 102  CO2 25 25 23 22 23   GLUCOSE 154* 127* 117* 101* 104*  BUN 35* 33* 27* 27* 28*  CREATININE 0.98 0.87 0.81 0.93 0.82  CALCIUM 8.0* 8.0* 7.9* 8.1* 8.2*   GFR: Estimated Creatinine Clearance: 88.6 mL/min (by C-G formula based on SCr of 0.82 mg/dL). Liver Function Tests: Recent Labs  Lab 12/30/19 0624 12/31/19 0501 01/01/20 0458 01/02/20 0551 01/03/20 0817  AST 41 39 38 43* 37  ALT 76* 69* 62* 61* 55*  ALKPHOS 62 61 64 62 65  BILITOT 0.8 1.0 1.0 1.1 1.4*  PROT 6.0* 5.7* 5.9* 5.9* 6.4*  ALBUMIN 2.4* 2.3* 2.4* 2.4* 2.5*   No results for input(s): LIPASE, AMYLASE in the last 168 hours. No results for input(s): AMMONIA in the last 168 hours. Coagulation Profile: No results for input(s): INR, PROTIME in the last 168 hours. Cardiac Enzymes: No results for input(s): CKTOTAL, CKMB, CKMBINDEX, TROPONINI in the last 168 hours. BNP (last 3 results) No results for input(s): PROBNP in the last 8760 hours. HbA1C: No results for input(s): HGBA1C in the last 72 hours. CBG: Recent Labs  Lab 01/03/20 0740 01/03/20 1150 01/03/20 1622 01/03/20 2053 01/04/20 0735  GLUCAP 104* 136* 140* 124* 105*   Lipid Profile: No results for input(s): CHOL, HDL, LDLCALC, TRIG, CHOLHDL, LDLDIRECT in the last 72 hours. Thyroid Function Tests: No results for input(s): TSH, T4TOTAL, FREET4, T3FREE, THYROIDAB in the last 72 hours. Anemia Panel: Recent Labs    01/02/20 0551 01/03/20 0817  FERRITIN 1,591* 2,675*   Sepsis Labs: Recent Labs  Lab 12/29/19 1419 12/29/19 1533 12/31/19 0501 01/01/20 0458 01/03/20 0817 01/04/20 0445  PROCALCITON   < >  --  0.52 0.25 <0.10 0.15  LATICACIDVEN  --  3.6*  --   --   --   --    < > = values in this interval not displayed.    Recent Results (from the past 240 hour(s))  Blood Culture (routine x 2)     Status: None   Collection Time: 12/29/19  2:19 PM   Specimen: BLOOD  Result Value Ref  Range Status   Specimen Description BLOOD LEFT ANTECUBITAL  Final   Special Requests   Final    BOTTLES DRAWN AEROBIC AND ANAEROBIC Blood Culture adequate volume   Culture   Final    NO GROWTH 5 DAYS Performed at [facility], [address], [place], [place] [address]    Report Status 01/03/2020 FINAL  Final  Blood Culture (routine x 2)     Status: None   Collection Time: 12/29/19  2:19 PM   Specimen: BLOOD  Result Value Ref Range Status   Specimen Description BLOOD RIGHT ANTECUBITAL  Final   Special Requests  Final    BOTTLES DRAWN AEROBIC AND ANAEROBIC Blood Culture adequate volume   Culture   Final    NO GROWTH 5 DAYS Performed at [facility], [address] [place], [place] [address]    Report Status 01/03/2020 FINAL  Final  SARS Coronavirus 2 by RT PCR (hospital order, performed in [facility] hospital lab) Nasopharyngeal Nasopharyngeal Swab     Status: Abnormal   Collection Time: 12/29/19  2:20 PM   Specimen: Nasopharyngeal Swab  Result Value Ref Range Status   SARS Coronavirus 2 POSITIVE (A) NEGATIVE Final    Comment: RESULT CALLED TO, READ BACK BY AND VERIFIED WITH: KATIE FERGUSSON @1606  12/29/19 MJU (NOTE) SARS-CoV-2 target nucleic acids are DETECTED  SARS-CoV-2 RNA is generally detectable in upper respiratory specimens  during the acute phase of infection.  Positive results are indicative  of the presence of the identified virus, but do not rule out bacterial infection or co-infection with other pathogens not detected by the test.  Clinical correlation with patient history and  other diagnostic information is necessary to determine patient infection status.  The expected result is negative.  Fact Sheet for Patients:   StrictlyIdeas.no   Fact Sheet for Healthcare Providers:   BankingDealers.co.za    This test is not yet approved or cleared by the [place] FDA and  has been authorized for  detection and/or diagnosis of SARS-CoV-2 by FDA under an Emergency Use Authorization (EUA).  This EUA will remain in effect (meaning this test  can be used) for the duration of  the COVID-19 declaration under Section 564(b)(1) of the Act, 21 U.S.C. section 360-bbb-3(b)(1), unless the authorization is terminated or revoked sooner.  Performed at [facility], [address], [place], [place] [address]      Radiology Studies: No results found.  Scheduled Meds: . ascorbic acid  1,000 mg Oral Daily  . cefUROXime  500 mg Oral BID WC  . fluticasone  1 spray Each Nare Daily  . insulin aspart  0-15 Units Subcutaneous TID WC  . insulin aspart  0-5 Units Subcutaneous QHS  . Ipratropium-Albuterol  2 puff Inhalation QID  . pneumococcal 23 valent vaccine  0.5 mL Intramuscular Tomorrow-1000  . polyethylene glycol  17 g Oral Daily  . predniSONE  50 mg Oral Daily  . Rivaroxaban  15 mg Oral BID WC   Followed by  . [START ON 01/21/2020] rivaroxaban  20 mg Oral Q supper  . zinc sulfate  220 mg Oral Daily   Continuous Infusions: . sodium chloride Stopped (12/31/19 1233)     LOS: 6 days   Time spent: 35 minutes.  Lorella Nimrod, MD [facility]  If 7PM-7AM, please contact night-coverage [email or web address]  01/04/2020, 8:26 AM   This record has been created using Systems analyst. Errors have been sought and corrected,but may not always be located. Such creation errors do not reflect on the standard of care.

## 2020-01-05 LAB — BASIC METABOLIC PANEL
Anion gap: 8 (ref 5–15)
BUN: 28 mg/dL — ABNORMAL HIGH (ref 8–23)
CO2: 26 mmol/L (ref 22–32)
Calcium: 8.1 mg/dL — ABNORMAL LOW (ref 8.9–10.3)
Chloride: 101 mmol/L (ref 98–111)
Creatinine, Ser: 0.93 mg/dL (ref 0.61–1.24)
GFR calc Af Amer: >60 mL/min (ref >60)
GFR calc non Af Amer: >60 mL/min (ref >60)
Glucose, Bld: 113 mg/dL — ABNORMAL HIGH (ref 70–99)
Potassium: 5 mmol/L (ref 3.5–5.1)
Sodium: 135 mmol/L (ref 135–145)

## 2020-01-05 LAB — CBC
HCT: 42.9 % (ref 39.0–52.0)
Hemoglobin: 14.8 g/dL (ref 13.0–17.0)
MCH: 29.5 pg (ref 26.0–34.0)
MCHC: 34.5 g/dL (ref 30.0–36.0)
MCV: 85.5 fL (ref 80.0–100.0)
Platelets: 259 10*3/uL (ref 150–400)
RBC: 5.02 MIL/uL (ref 4.22–5.81)
RDW: 13.3 % (ref 11.5–15.5)
WBC: 17.2 10*3/uL — ABNORMAL HIGH (ref 4.0–10.5)
nRBC: 0 % (ref 0.0–0.2)

## 2020-01-05 LAB — PROCALCITONIN: Procalcitonin: 0.12 ng/mL

## 2020-01-05 LAB — GLUCOSE, CAPILLARY
Glucose-Capillary: 121 mg/dL — ABNORMAL HIGH (ref 70–99)
Glucose-Capillary: 114 mg/dL — ABNORMAL HIGH (ref 70–99)
Glucose-Capillary: 149 mg/dL — ABNORMAL HIGH (ref 70–99)
Glucose-Capillary: 135 mg/dL — ABNORMAL HIGH (ref 70–99)

## 2020-01-05 LAB — FIBRIN DERIVATIVES D-DIMER (ARMC ONLY): Fibrin derivatives D-dimer (ARMC): >7500 ng/mL (FEU) — ABNORMAL HIGH (ref 0.00–499.00)

## 2020-01-05 LAB — C-REACTIVE PROTEIN: CRP: 13.4 mg/dL — ABNORMAL HIGH (ref <1.0)

## 2020-01-05 NOTE — Progress Notes (Signed)
PROGRESS NOTE    Brett Maldonado  NTZ:[id number] DOB: October 16, 1950 DOA: 12/29/2019 PCP: System, Pcp Not In   Brief Narrative: Taken from H&P. Brett Maldonado is a 69 y.o. M with no significant PMHx who presents with 1 week cough and COVID exposure.Pt. is unvaccinated. Admitted for COVID-19 pneumonia.  Also developed PE. requiring up to 15L of O2, slowly improving.  Subjective: Feeling better today. Saturating low to mid 90's on 6L.Do desaturate some with minor exertion.  Assessment & Plan:   Principal Problem:   COVID-19 Active Problems:   Transaminitis   Hyponatremia   Essential hypertension   Acute on chronic respiratory failure with hypoxia (HCC)   Pneumonia due to COVID-19 virus  Acute hypoxic respiratory failure secondary to COVID-19 pneumonia. Show some improvement in oxygenation, able to wean him to 6L, oxygen requirement increasing with some exertion. Elevated inflammatory markers. CTA  with subsegmental lower lobes emboli. Lower extremity venous Doppler was negative for DVT. Procalcitonin elevated at 0.52>>0.25>><0.10>>0.15 Patient was initially started on baricitinib which was discontinued after 6 days due to worsening leukocytosis. Completed a course of remdesivir and antibiotics. -Solu-Medrol transition to prednisone. -Continue supportive care and supplement. -Continue supplemental oxygen to keep the saturation above 90%. -PT evaluation- Recommending HH-ordered.  Pulmonary embolism. Most likely secondary to COVID-19 infection. Patient was on therapeutic Lovenox, switched to Xarelto on 12/31/19. -Continue Xarelto.  Hyperglycemia. A1c of 6.1. No prior diagnosis of diabetes. Patient is on steroid. -Continue sliding scale.  Constipation. -Continue bowel regimen with MiraLAX daily.  Objective: Vitals:   01/04/20 1555 01/04/20 1920 01/05/20 0400 01/05/20 0745  BP: (!) 152/76 132/61 139/60 121/73  Pulse: 91  65 87  Resp: (!) 21 20  20   Temp: 97.7 F (36.5 C) 98 F  (36.7 C) 97.7 F (36.5 C) 98.6 F (37 C)  TempSrc:  Oral Oral Oral  SpO2: 96%  95% 93%  Weight:      Height:        Intake/Output Summary (Last 24 hours) at 01/05/2020 0752 Last data filed at 01/04/2020 1500 Gross per 24 hour  Intake --  Output 400 ml  Net -400 ml   Filed Weights   12/29/19 1417  Weight: 77.1 kg    Examination:  General.  Well-developed gentleman, in no acute distress. Pulmonary. Few scattered dry crackles, normal respiratory effort. CV.  Regular rate and rhythm, no JVD, rub or murmur. Abdomen.  Soft, nontender, nondistended, BS positive. CNS.  Alert and oriented x3.  No focal neurologic deficit. Extremities.  No edema, no cyanosis, pulses intact and symmetrical. Psychiatry.  Judgment and insight appears normal.  DVT prophylaxis: Xarelto Code Status: Full Family Communication: Wife was updated on phone. Disposition Plan:  Status is: Inpatient  Remains inpatient appropriate because:Inpatient level of care appropriate due to severity of illness   Dispo: The patient is from: Home              Anticipated d/c is to: Home              Anticipated d/c date is: 3 days.              Patient currently is not medically stable to d/c.    Consultants:   Procedures:  Antimicrobials:   Data Reviewed: I have personally reviewed following labs and imaging studies  CBC: Recent Labs  Lab 12/30/19 0624 12/30/19 6759 12/31/19 0501 12/31/19 0501 01/01/20 0458 01/02/20 0551 01/03/20 0817 01/04/20 0445 01/05/20 0502  WBC 8.7   < >  11.8*   < > 12.1* 13.2* 20.4* 19.4* 17.2*  NEUTROABS 7.7  --  10.6*  --  11.3* 12.5* 19.3*  --   --   HGB 13.7   < > 13.3   < > 13.4 14.2 15.3 15.3 14.8  HCT 38.0*   < > 38.1*   < > 39.5 41.0 44.4 42.9 42.9  MCV 83.0   < > 85.2   < > 87.0 86.3 85.4 85.8 85.5  PLT 276   < > 341   < > 350 282 231 240 259   < > = values in this interval not displayed.   Basic Metabolic Panel: Recent Labs  Lab 01/01/20 0458 01/02/20 0551  01/03/20 0817 01/04/20 0445 01/05/20 0502  NA 137 137 134* 134* 135  K 5.0 4.8 4.8 4.5 5.0  CL 103 105 100 102 101  CO2 25 23 22 23 26   GLUCOSE 127* 117* 101* 104* 113*  BUN 33* 27* 27* 28* 28*  CREATININE 0.87 0.81 0.93 0.82 0.93  CALCIUM 8.0* 7.9* 8.1* 8.2* 8.1*   GFR: Estimated Creatinine Clearance: 78.1 mL/min (by C-G formula based on SCr of 0.93 mg/dL). Liver Function Tests: Recent Labs  Lab 12/30/19 0624 12/31/19 0501 01/01/20 0458 01/02/20 0551 01/03/20 0817  AST 41 39 38 43* 37  ALT 76* 69* 62* 61* 55*  ALKPHOS 62 61 64 62 65  BILITOT 0.8 1.0 1.0 1.1 1.4*  PROT 6.0* 5.7* 5.9* 5.9* 6.4*  ALBUMIN 2.4* 2.3* 2.4* 2.4* 2.5*   No results for input(s): LIPASE, AMYLASE in the last 168 hours. No results for input(s): AMMONIA in the last 168 hours. Coagulation Profile: No results for input(s): INR, PROTIME in the last 168 hours. Cardiac Enzymes: No results for input(s): CKTOTAL, CKMB, CKMBINDEX, TROPONINI in the last 168 hours. BNP (last 3 results) No results for input(s): PROBNP in the last 8760 hours. HbA1C: No results for input(s): HGBA1C in the last 72 hours. CBG: Recent Labs  Lab 01/04/20 0735 01/04/20 1125 01/04/20 1620 01/04/20 2113 01/05/20 0743  GLUCAP 105* 178* 116* 115* 121*   Lipid Profile: No results for input(s): CHOL, HDL, LDLCALC, TRIG, CHOLHDL, LDLDIRECT in the last 72 hours. Thyroid Function Tests: No results for input(s): TSH, T4TOTAL, FREET4, T3FREE, THYROIDAB in the last 72 hours. Anemia Panel: Recent Labs    01/03/20 0817  FERRITIN 2,675*   Sepsis Labs: Recent Labs  Lab 12/29/19 1533 12/31/19 0501 01/01/20 0458 01/03/20 0817 01/04/20 0445 01/05/20 0502  PROCALCITON  --    < > 0.25 <0.10 0.15 0.12  LATICACIDVEN 3.6*  --   --   --   --   --    < > = values in this interval not displayed.    Recent Results (from the past 240 hour(s))  Blood Culture (routine x 2)     Status: None   Collection Time: 12/29/19  2:19 PM    Specimen: BLOOD  Result Value Ref Range Status   Specimen Description BLOOD LEFT ANTECUBITAL  Final   Special Requests   Final    BOTTLES DRAWN AEROBIC AND ANAEROBIC Blood Culture adequate volume   Culture   Final    NO GROWTH 5 DAYS Performed at [facility], [address], [place],  [address]    Report Status 01/03/2020 FINAL  Final  Blood Culture (routine x 2)     Status: None   Collection Time: 12/29/19  2:19 PM   Specimen: BLOOD  Result Value Ref Range  Status   Specimen Description BLOOD RIGHT ANTECUBITAL  Final   Special Requests   Final    BOTTLES DRAWN AEROBIC AND ANAEROBIC Blood Culture adequate volume   Culture   Final    NO GROWTH 5 DAYS Performed at [facility], [address] [place], [place] [address]    Report Status 01/03/2020 FINAL  Final  SARS Coronavirus 2 by RT PCR (hospital order, performed in [facility] hospital lab) Nasopharyngeal Nasopharyngeal Swab     Status: Abnormal   Collection Time: 12/29/19  2:20 PM   Specimen: Nasopharyngeal Swab  Result Value Ref Range Status   SARS Coronavirus 2 POSITIVE (A) NEGATIVE Final    Comment: RESULT CALLED TO, READ BACK BY AND VERIFIED WITH: KATIE FERGUSSON @1606  12/29/19 MJU (NOTE) SARS-CoV-2 target nucleic acids are DETECTED  SARS-CoV-2 RNA is generally detectable in upper respiratory specimens  during the acute phase of infection.  Positive results are indicative  of the presence of the identified virus, but do not rule out bacterial infection or co-infection with other pathogens not detected by the test.  Clinical correlation with patient history and  other diagnostic information is necessary to determine patient infection status.  The expected result is negative.  Fact Sheet for Patients:   StrictlyIdeas.no   Fact Sheet for Healthcare Providers:   BankingDealers.co.za    This test is not yet approved or cleared by the [place]  FDA and  has been authorized for detection and/or diagnosis of SARS-CoV-2 by FDA under an Emergency Use Authorization (EUA).  This EUA will remain in effect (meaning this test  can be used) for the duration of  the COVID-19 declaration under Section 564(b)(1) of the Act, 21 U.S.C. section 360-bbb-3(b)(1), unless the authorization is terminated or revoked sooner.  Performed at [facility], [address] [place], [place] [address]      Radiology Studies: No results found.  Scheduled Meds: . ascorbic acid  1,000 mg Oral Daily  . fluticasone  1 spray Each Nare Daily  . insulin aspart  0-15 Units Subcutaneous TID WC  . insulin aspart  0-5 Units Subcutaneous QHS  . Ipratropium-Albuterol  2 puff Inhalation QID  . pneumococcal 23 valent vaccine  0.5 mL Intramuscular Tomorrow-1000  . polyethylene glycol  17 g Oral Daily  . predniSONE  50 mg Oral Daily  . Rivaroxaban  15 mg Oral BID WC   Followed by  . [START ON 01/21/2020] rivaroxaban  20 mg Oral Q supper  . zinc sulfate  220 mg Oral Daily   Continuous Infusions: . sodium chloride Stopped (12/31/19 1233)     LOS: 7 days   Time spent: 30 minutes.  Lorella Nimrod, MD [facility]  If 7PM-7AM, please contact night-coverage [email or web address]  01/05/2020, 7:52 AM   This record has been created using Systems analyst. Errors have been sought and corrected,but may not always be located. Such creation errors do not reflect on the standard of care.

## 2020-01-05 NOTE — Consult Note (Signed)
[place] for Transition to Xarelto from Lovenox Indication: pulmonary embolus  No Known Allergies  Patient Measurements: Height: 5\' 9"  (175.3 cm) Weight: 77.1 kg (170 lb) IBW/kg (Calculated) : 70.7 Heparin Dosing Weight: 77.1 kg   Vital Signs: Temp: 98.6 F (37 C) (09/13 0745) Temp Source: Oral (09/13 0745) BP: 121/73 (09/13 0745) Pulse Rate: 87 (09/13 0745)  Labs: Recent Labs    01/03/20 0817 01/03/20 0817 01/04/20 0445 01/05/20 0502  HGB 15.3   < > 15.3 14.8  HCT 44.4  --  42.9 42.9  PLT 231  --  240 259  CREATININE 0.93  --  0.82 0.93   < > = values in this interval not displayed.    Estimated Creatinine Clearance: 78.1 mL/min (by C-G formula based on SCr of 0.93 mg/dL).   Medications:  No PTA anticoagulants   Assessment: 69 year old male admitted 12/29/19 with 1 week history of cough and COVID exposure. Pt has no significant PMH. Pt is COVID positive. D-Dimer > 7500 - pt found to have PE on CT chest. Pt was started on Lovenox for PE - pharmacy now consulted to transition to [place].    Goal of Therapy:  Monitor platelets by anticoagulation protocol: Yes   Plan:  - Continue Xarelto15 mg BID until 9/29 when pt will begin taking 20 mg daily   -Pharmacy will follow CBC and Scr every three days, per protocol  - Will attempt to counsel pt again today  Lu Duffel, PharmD, BCPS Clinical Pharmacist 01/05/2020 8:34 AM

## 2020-01-06 LAB — CBC
HCT: 42 % (ref 39.0–52.0)
Hemoglobin: 14.9 g/dL (ref 13.0–17.0)
MCH: 30 pg (ref 26.0–34.0)
MCHC: 35.5 g/dL (ref 30.0–36.0)
MCV: 84.7 fL (ref 80.0–100.0)
Platelets: 284 10*3/uL (ref 150–400)
RBC: 4.96 MIL/uL (ref 4.22–5.81)
RDW: 13.2 % (ref 11.5–15.5)
WBC: 13.6 10*3/uL — ABNORMAL HIGH (ref 4.0–10.5)
nRBC: 0 % (ref 0.0–0.2)

## 2020-01-06 LAB — GLUCOSE, CAPILLARY
Glucose-Capillary: 92 mg/dL (ref 70–99)
Glucose-Capillary: 138 mg/dL — ABNORMAL HIGH (ref 70–99)
Glucose-Capillary: 152 mg/dL — ABNORMAL HIGH (ref 70–99)
Glucose-Capillary: 153 mg/dL — ABNORMAL HIGH (ref 70–99)

## 2020-01-06 LAB — BASIC METABOLIC PANEL
Anion gap: 6 (ref 5–15)
BUN: 28 mg/dL — ABNORMAL HIGH (ref 8–23)
CO2: 28 mmol/L (ref 22–32)
Calcium: 8.2 mg/dL — ABNORMAL LOW (ref 8.9–10.3)
Chloride: 101 mmol/L (ref 98–111)
Creatinine, Ser: 1.02 mg/dL (ref 0.61–1.24)
GFR calc Af Amer: >60 mL/min (ref >60)
GFR calc non Af Amer: >60 mL/min (ref >60)
Glucose, Bld: 106 mg/dL — ABNORMAL HIGH (ref 70–99)
Potassium: 5.4 mmol/L — ABNORMAL HIGH (ref 3.5–5.1)
Sodium: 135 mmol/L (ref 135–145)

## 2020-01-06 LAB — FIBRIN DERIVATIVES D-DIMER (ARMC ONLY): Fibrin derivatives D-dimer (ARMC): 5616.92 ng/mL (FEU) — ABNORMAL HIGH (ref 0.00–499.00)

## 2020-01-06 LAB — C-REACTIVE PROTEIN: CRP: 8.6 mg/dL — ABNORMAL HIGH (ref <1.0)

## 2020-01-06 MED ORDER — FUROSEMIDE 10 MG/ML IJ SOLN
40.0000 mg | Freq: Once | INTRAMUSCULAR | Status: AC
  Administered 2020-01-06: 12:00:00 40 mg via INTRAVENOUS
  Filled 2020-01-06: qty 4

## 2020-01-06 MED ORDER — SODIUM ZIRCONIUM CYCLOSILICATE 10 G PO PACK
10.0000 g | PACK | Freq: Every day | ORAL | Status: DC
  Administered 2020-01-06: 10 g via ORAL
  Filled 2020-01-06 (×2): qty 1

## 2020-01-06 NOTE — Progress Notes (Signed)
PROGRESS NOTE    Brett Maldonado  WUJ:[id number] DOB: [birth year] DOA: 12/29/2019 PCP: System, Pcp Not In   Brief Narrative: Taken from H&P. Brett Maldonado is a 69 y.o. M with no significant PMHx who presents with 1 week cough and COVID exposure.Pt. is unvaccinated. Admitted for COVID-19 pneumonia.  Also developed PE. requiring up to 15L of O2, slowly improving.  Subjective: Complaining of chest congestion. Continue to have significant desaturation with minor exertion.  Assessment & Plan:   Principal Problem:   COVID-19 Active Problems:   Transaminitis   Hyponatremia   Essential hypertension   Acute on chronic respiratory failure with hypoxia (HCC)   Pneumonia due to COVID-19 virus  Acute hypoxic respiratory failure secondary to COVID-19 pneumonia. Show some improvement in oxygenation, able to wean him to 6L, oxygen requirement increasing with some exertion. Elevated inflammatory markers. CTA  with subsegmental lower lobes emboli. Lower extremity venous Doppler was negative for DVT. Procalcitonin elevated at 0.52>>0.25>><0.10>>0.15 Patient was initially started on baricitinib which was discontinued after 6 days due to worsening leukocytosis. Completed a course of remdesivir and antibiotics. -Solu-Medrol transition to prednisone. -Continue supportive care and supplement. -Continue supplemental oxygen to keep the saturation above 90%. -PT evaluation- Recommending HH-ordered.  Hyperkalemia.Potasium at 5.4 today. -Give Lokelma. -One dose of Lasix  Non Sustained V Tach.19 beats of non sustained V tach. Reported on tele. Patient remained asymptomatic. -Continue to monitor  Pulmonary embolism. Most likely secondary to COVID-19 infection. Patient was on therapeutic Lovenox, switched to Xarelto on 12/31/19. -Continue Xarelto.  Hyperglycemia. A1c of 6.1. No prior diagnosis of diabetes. Patient is on steroid. -Continue sliding scale.  Constipation. -Continue bowel regimen with  MiraLAX daily.  Objective: Vitals:   01/06/20 1039 01/06/20 1040 01/06/20 1043 01/06/20 1046  BP:      Pulse:      Resp:      Temp:      TempSrc:      SpO2: (!) 85% 93% (!) 86% 94%  Weight:      Height:        Intake/Output Summary (Last 24 hours) at 01/06/2020 1107 Last data filed at 01/06/2020 0900 Gross per 24 hour  Intake 360 ml  Output 1375 ml  Net -1015 ml   Filed Weights   12/29/19 1417  Weight: 77.1 kg    Examination:  General. Well developed gentleman, In no acute distress. Pulmonary.  Lungs clear bilaterally, normal respiratory effort. CV.  Regular rate and rhythm, no JVD, rub or murmur. Abdomen.  Soft, nontender, nondistended, BS positive. CNS.  Alert and oriented x3.  No focal neurologic deficit. Extremities.  No edema, no cyanosis, pulses intact and symmetrical. Psychiatry.  Judgment and insight appears normal.  DVT prophylaxis: Xarelto Code Status: Full Family Communication: Wife was updated on phone. Disposition Plan:  Status is: Inpatient  Remains inpatient appropriate because:Inpatient level of care appropriate due to severity of illness   Dispo: The patient is from: Home              Anticipated d/c is to: Home              Anticipated d/c date is: 1-2 days.              Patient currently is not medically stable to d/c.    Consultants:   Procedures:  Antimicrobials:   Data Reviewed: I have personally reviewed following labs and imaging studies  CBC: Recent Labs  Lab 12/31/19 0501 12/31/19 0501 01/01/20 0458 01/01/20  6160 01/02/20 0551 01/03/20 0817 01/04/20 0445 01/05/20 0502 01/06/20 0540  WBC 11.8*   < > 12.1*   < > 13.2* 20.4* 19.4* 17.2* 13.6*  NEUTROABS 10.6*  --  11.3*  --  12.5* 19.3*  --   --   --   HGB 13.3   < > 13.4   < > 14.2 15.3 15.3 14.8 14.9  HCT 38.1*   < > 39.5   < > 41.0 44.4 42.9 42.9 42.0  MCV 85.2   < > 87.0   < > 86.3 85.4 85.8 85.5 84.7  PLT 341   < > 350   < > 282 231 240 259 284   < > = values in  this interval not displayed.   Basic Metabolic Panel: Recent Labs  Lab 01/02/20 0551 01/03/20 0817 01/04/20 0445 01/05/20 0502 01/06/20 0540  NA 137 134* 134* 135 135  K 4.8 4.8 4.5 5.0 5.4*  CL 105 100 102 101 101  CO2 23 22 23 26 28   GLUCOSE 117* 101* 104* 113* 106*  BUN 27* 27* 28* 28* 28*  CREATININE 0.81 0.93 0.82 0.93 1.02  CALCIUM 7.9* 8.1* 8.2* 8.1* 8.2*   GFR: Estimated Creatinine Clearance: 71.2 mL/min (by C-G formula based on SCr of 1.02 mg/dL). Liver Function Tests: Recent Labs  Lab 12/31/19 0501 01/01/20 0458 01/02/20 0551 01/03/20 0817  AST 39 38 43* 37  ALT 69* 62* 61* 55*  ALKPHOS 61 64 62 65  BILITOT 1.0 1.0 1.1 1.4*  PROT 5.7* 5.9* 5.9* 6.4*  ALBUMIN 2.3* 2.4* 2.4* 2.5*   No results for input(s): LIPASE, AMYLASE in the last 168 hours. No results for input(s): AMMONIA in the last 168 hours. Coagulation Profile: No results for input(s): INR, PROTIME in the last 168 hours. Cardiac Enzymes: No results for input(s): CKTOTAL, CKMB, CKMBINDEX, TROPONINI in the last 168 hours. BNP (last 3 results) No results for input(s): PROBNP in the last 8760 hours. HbA1C: No results for input(s): HGBA1C in the last 72 hours. CBG: Recent Labs  Lab 01/05/20 0743 01/05/20 1154 01/05/20 1631 01/05/20 1952 01/06/20 0752  GLUCAP 121* 114* 149* 135* 92   Lipid Profile: No results for input(s): CHOL, HDL, LDLCALC, TRIG, CHOLHDL, LDLDIRECT in the last 72 hours. Thyroid Function Tests: No results for input(s): TSH, T4TOTAL, FREET4, T3FREE, THYROIDAB in the last 72 hours. Anemia Panel: No results for input(s): VITAMINB12, FOLATE, FERRITIN, TIBC, IRON, RETICCTPCT in the last 72 hours. Sepsis Labs: Recent Labs  Lab 01/01/20 0458 01/03/20 0817 01/04/20 0445 01/05/20 0502  PROCALCITON 0.25 <0.10 0.15 0.12    Recent Results (from the past 240 hour(s))  Blood Culture (routine x 2)     Status: None   Collection Time: 12/29/19  2:19 PM   Specimen: BLOOD  Result  Value Ref Range Status   Specimen Description BLOOD LEFT ANTECUBITAL  Final   Special Requests   Final    BOTTLES DRAWN AEROBIC AND ANAEROBIC Blood Culture adequate volume   Culture   Final    NO GROWTH 5 DAYS Performed at [facility], [address] [place], [place] [address]    Report Status 01/03/2020 FINAL  Final  Blood Culture (routine x 2)     Status: None   Collection Time: 12/29/19  2:19 PM   Specimen: BLOOD  Result Value Ref Range Status   Specimen Description BLOOD RIGHT ANTECUBITAL  Final   Special Requests   Final    BOTTLES DRAWN AEROBIC AND ANAEROBIC  Blood Culture adequate volume   Culture   Final    NO GROWTH 5 DAYS Performed at [facility], [address] [place], [place] [address]    Report Status 01/03/2020 FINAL  Final  SARS Coronavirus 2 by RT PCR (hospital order, performed in [facility] hospital lab) Nasopharyngeal Nasopharyngeal Swab     Status: Abnormal   Collection Time: 12/29/19  2:20 PM   Specimen: Nasopharyngeal Swab  Result Value Ref Range Status   SARS Coronavirus 2 POSITIVE (A) NEGATIVE Final    Comment: RESULT CALLED TO, READ BACK BY AND VERIFIED WITH: KATIE FERGUSSON @1606  12/29/19 MJU (NOTE) SARS-CoV-2 target nucleic acids are DETECTED  SARS-CoV-2 RNA is generally detectable in upper respiratory specimens  during the acute phase of infection.  Positive results are indicative  of the presence of the identified virus, but do not rule out bacterial infection or co-infection with other pathogens not detected by the test.  Clinical correlation with patient history and  other diagnostic information is necessary to determine patient infection status.  The expected result is negative.  Fact Sheet for Patients:   StrictlyIdeas.no   Fact Sheet for Healthcare Providers:   BankingDealers.co.za    This test is not yet approved or cleared by the [place] FDA and  has been  authorized for detection and/or diagnosis of SARS-CoV-2 by FDA under an Emergency Use Authorization (EUA).  This EUA will remain in effect (meaning this test  can be used) for the duration of  the COVID-19 declaration under Section 564(b)(1) of the Act, 21 U.S.C. section 360-bbb-3(b)(1), unless the authorization is terminated or revoked sooner.  Performed at [facility], [address], [place], [place] [address]      Radiology Studies: No results found.  Scheduled Meds: . ascorbic acid  1,000 mg Oral Daily  . fluticasone  1 spray Each Nare Daily  . furosemide  40 mg Intravenous Once  . insulin aspart  0-15 Units Subcutaneous TID WC  . insulin aspart  0-5 Units Subcutaneous QHS  . Ipratropium-Albuterol  2 puff Inhalation QID  . pneumococcal 23 valent vaccine  0.5 mL Intramuscular Tomorrow-1000  . polyethylene glycol  17 g Oral Daily  . predniSONE  50 mg Oral Daily  . Rivaroxaban  15 mg Oral BID WC   Followed by  . [START ON 01/21/2020] rivaroxaban  20 mg Oral Q supper  . sodium zirconium cyclosilicate  10 g Oral Daily  . zinc sulfate  220 mg Oral Daily   Continuous Infusions: . sodium chloride Stopped (12/31/19 1233)     LOS: 8 days   Time spent: 30 minutes.  Lorella Nimrod, MD [facility]  If 7PM-7AM, please contact night-coverage [email or web address]  01/06/2020, 11:07 AM   This record has been created using Systems analyst. Errors have been sought and corrected,but may not always be located. Such creation errors do not reflect on the standard of care.

## 2020-01-06 NOTE — Progress Notes (Signed)
Physical Therapy Treatment Patient Details Name: Brett Maldonado MRN: [id number] DOB: [birth year] Today's Date: 01/06/2020    History of Present Illness Patient is a 69 year old male admitted with COVID 19, transaminitis, hponatremia, HTN, pneumonia. Patient's wife developed flu like symptoms, shortly after patient developed dry cough, aches, malaise and fatigue. In ER he was dyspneic saturatign 68% on room air with positive COVID x ray showed bilateral opacities. PMH includes basa cell carcinoma and HTN. Patient walks unassisted and at baseline does not use oxygen. New diagnosis of PE    PT Comments    Pt received in supine position upon arrival and agreeable to therapy.  Pt has good work ethic and consistently states he wants to perform well and get stronger, noting a decrease in muscle mass of his BLE.  Pt notes he has been consistently performing bed-level exercises in the hospital throughout the day.  Pt performs therapeutic exercises well with a mild drop in O2 to 90%.  Pt then proceeded to perform standing transfer from seated position and did so well with use of UE to push through bed to come upright.  Pt then was able to stand statically for 1 minute before needed a seated rest break.  He was able to perform static standing balance x2 with a rest break in between.  Pt then performed standing marches with UEs supported on FWW.  Pt then proceeded to perform 3x15 minisquats with rest breaks required for SpO2 sat to return to baseline.  O2 monitored throughout session, never falling below 84%.  Current d/c plans remain appropriate at this time.  Pt will continue to benefit from skilled therapy in order to address deficits listed below.     Follow Up Recommendations  Home health PT;Supervision - Intermittent     Equipment Recommendations  3in1 (PT);Rolling walker with 5" wheels    Recommendations for Other Services       Precautions / Restrictions      Mobility  Bed Mobility                   Transfers                    Ambulation/Gait                 Stairs             Wheelchair Mobility    Modified Rankin (Stroke Patients Only)       Balance Overall balance assessment: Needs assistance Sitting-balance support: No upper extremity supported;Feet supported Sitting balance-Leahy Scale: Good Sitting balance - Comments: Pt has good sitting posture and SpO2 stayed consistently above 84%   Standing balance support: No upper extremity supported Standing balance-Leahy Scale: Fair Standing balance comment: Able to stand and perform marching in place with BUEs supported on FWW.                            Cognition Arousal/Alertness: Awake/alert Behavior During Therapy: WFL for tasks assessed/performed Overall Cognitive Status: Within Functional Limits for tasks assessed                                 General Comments: Patient alert and eager to participate in PT      Exercises General Exercises - Lower Extremity Ankle Circles/Pumps: Strengthening;Both;15 reps;Supine Quad Sets: Strengthening;Both;5 reps;Supine Gluteal Sets: Strengthening;Both;10 reps;Supine Long Arc  Quad: Strengthening;Both;10 reps;Seated Heel Slides: Strengthening;Both;5 reps;Supine Hip ABduction/ADduction: Strengthening;Both;5 reps;Supine Straight Leg Raises: Strengthening;Both;5 reps;Supine Hip Flexion/Marching: Strengthening;Both;5 reps;Seated Mini-Sqauts: Strengthening;Both;15 reps;Seated Other Exercises Other Exercises: STS transferred performed with CGA x 4.  Standing balance performed 2x1 min bouts    General Comments        Pertinent Vitals/Pain Pain Assessment: No/denies pain    Home Living                      Prior Function            PT Goals (current goals can now be found in the care plan section) Acute Rehab PT Goals Patient Stated Goal: to return home PT Goal Formulation: With patient Time  For Goal Achievement: 01/18/20 Potential to Achieve Goals: Fair Progress towards PT goals: Progressing toward goals    Frequency    Min 2X/week      PT Plan      Co-evaluation              AM-PAC PT "6 Clicks" Mobility   Outcome Measure  Help needed turning from your back to your side while in a flat bed without using bedrails?: None Help needed moving from lying on your back to sitting on the side of a flat bed without using bedrails?: A Little Help needed moving to and from a bed to a chair (including a wheelchair)?: A Little Help needed standing up from a chair using your arms (e.g., wheelchair or bedside chair)?: A Little Help needed to walk in hospital room?: A Lot Help needed climbing 3-5 steps with a railing? : A Lot 6 Click Score: 17    End of Session Equipment Utilized During Treatment: Gait belt;Oxygen (initially on 6L, had to use hi-flow for seated/standing on 15L) Activity Tolerance: Patient tolerated treatment well;Patient limited by fatigue Patient left: in bed;with call bell/phone within reach;with bed alarm set Nurse Communication: Mobility status PT Visit Diagnosis: Unsteadiness on feet (R26.81);Other abnormalities of gait and mobility (R26.89);Muscle weakness (generalized) (M62.81);Difficulty in walking, not elsewhere classified (R26.2)     Time: 0177-9390 PT Time Calculation (min) (ACUTE ONLY): 27 min  Charges:  $Therapeutic Exercise: 23-37 mins                     Gwenlyn Saran, PT, DPT 01/06/20, 4:08 PM

## 2020-01-07 LAB — BASIC METABOLIC PANEL
Anion gap: 11 (ref 5–15)
BUN: 35 mg/dL — ABNORMAL HIGH (ref 8–23)
CO2: 28 mmol/L (ref 22–32)
Calcium: 8.3 mg/dL — ABNORMAL LOW (ref 8.9–10.3)
Chloride: 96 mmol/L — ABNORMAL LOW (ref 98–111)
Creatinine, Ser: 1.06 mg/dL (ref 0.61–1.24)
GFR calc Af Amer: >60 mL/min (ref >60)
GFR calc non Af Amer: >60 mL/min (ref >60)
Glucose, Bld: 104 mg/dL — ABNORMAL HIGH (ref 70–99)
Potassium: 4.4 mmol/L (ref 3.5–5.1)
Sodium: 135 mmol/L (ref 135–145)

## 2020-01-07 LAB — GLUCOSE, CAPILLARY
Glucose-Capillary: 96 mg/dL (ref 70–99)
Glucose-Capillary: 199 mg/dL — ABNORMAL HIGH (ref 70–99)

## 2020-01-07 LAB — CBC
HCT: 45.3 % (ref 39.0–52.0)
Hemoglobin: 15.9 g/dL (ref 13.0–17.0)
MCH: 29.7 pg (ref 26.0–34.0)
MCHC: 35.1 g/dL (ref 30.0–36.0)
MCV: 84.5 fL (ref 80.0–100.0)
Platelets: 343 10*3/uL (ref 150–400)
RBC: 5.36 MIL/uL (ref 4.22–5.81)
RDW: 13 % (ref 11.5–15.5)
WBC: 13.9 10*3/uL — ABNORMAL HIGH (ref 4.0–10.5)
nRBC: 0 % (ref 0.0–0.2)

## 2020-01-07 LAB — FIBRIN DERIVATIVES D-DIMER (ARMC ONLY): Fibrin derivatives D-dimer (ARMC): 5596.11 ng/mL (FEU) — ABNORMAL HIGH (ref 0.00–499.00)

## 2020-01-07 LAB — C-REACTIVE PROTEIN: CRP: 6.2 mg/dL — ABNORMAL HIGH (ref <1.0)

## 2020-01-07 MED ORDER — GUAIFENESIN-DM 100-10 MG/5ML PO SYRP: 10.0000 mL | ORAL_SOLUTION | ORAL | 0 refills | Status: AC | PRN

## 2020-01-07 MED ORDER — IPRATROPIUM-ALBUTEROL 20-100 MCG/ACT IN AERS: 2.0000 | INHALATION_SPRAY | Freq: Four times a day (QID) | RESPIRATORY_TRACT | 1 refills | Status: AC

## 2020-01-07 MED ORDER — SALINE SPRAY 0.65 % NA SOLN: 1.0000 | NASAL | 1 refills | Status: AC | PRN

## 2020-01-07 MED ORDER — RIVAROXABAN (XARELTO) VTE STARTER PACK (15 & 20 MG): ORAL_TABLET | ORAL | 0 refills | Status: AC

## 2020-01-07 MED ORDER — PREDNISONE 10 MG (21) PO TBPK: ORAL_TABLET | ORAL | 0 refills | Status: AC

## 2020-01-07 MED ORDER — FUROSEMIDE 10 MG/ML IJ SOLN
40.0000 mg | Freq: Once | INTRAMUSCULAR | Status: AC
  Administered 2020-01-07: 40 mg via INTRAVENOUS
  Filled 2020-01-07: qty 4

## 2020-01-07 MED ORDER — FLUTICASONE PROPIONATE 50 MCG/ACT NA SUSP: 1.0000 | Freq: Every day | NASAL | 2 refills | Status: AC

## 2020-01-07 NOTE — Discharge Summary (Signed)
Physician Discharge Summary  Brett Maldonado YSA:[id number] DOB: 1951/04/22 DOA: 12/29/2019  PCP: System, Pcp Not In  Admit date: 12/29/2019 Discharge date: 01/07/2020  Admitted From: Home Disposition: Home   Recommendations for Outpatient Follow-up:  1. Follow up with PCP in 1-2 weeks 2. Please obtain BMP/CBC in one week 3. Please follow up on the following pending results:None  Home Health:Yes Equipment/Devices: Home oxygen, rolling walker Discharge Condition: Stable CODE STATUS: Partial, DNI Diet recommendation: Heart Healthy / Carb Modified   Brief/Interim Summary: Brett Maldonado a 69 y.o.Mwith no significant PMHxwho presents with 1 week cough and COVID exposure.Pt. is unvaccinated. Admitted for COVID-19 pneumonia.  Also developed PE. requiring up to 15L of O2 and nonrebreather.  Able to wean him down slowly.  On the day of discharge he was saturating well on 4 L while resting but desaturate with minor exertion requiring up to 6 L of oxygen. Patient completed the course of remdesivir and discharged on tapering dose of steroid.  He was also given baricitinib for 6 days and it was discontinued due to worsening leukocytosis.  He received ceftriaxone and Zithromax for superadded bacterial infection and completed the course while in the hospital.  Patient also developed PE and was started on Xarelto.  He will need Xarelto for at least 32-month and follow-up with her primary care provider for further need.  Patient did develop some hyperglycemia secondary to steroid.  A1c of 6.1.  Hyperglycemia managed with sliding scale while in the hospital.  He also developed some electrolyte abnormalities which were corrected before discharge.  Our physical therapist recommended home health services which were ordered.  Discharge Diagnoses:  Principal Problem:   COVID-19 Active Problems:   Transaminitis   Hyponatremia   Essential hypertension   Acute on chronic respiratory failure with  hypoxia (HCC)   Pneumonia due to COVID-19 virus   Discharge Instructions  Discharge Instructions    Diet - low sodium heart healthy   Complete by: As directed    Discharge instructions   Complete by: As directed    It was pleasure taking care of you. You are being discharged with oxygen, you need 4 L while resting and increased to 6 L with any exertion. Please follow-up with your primary care provider closely and monitor your oxygen level, you can decrease your oxygen according to your needs, keep saturation above 90%. You are also being discharged on blood thinner called Xarelto, take 15 mg twice daily for another 2 weeks and then start taking 20 mg daily.  You have to take this medicine for at least 72-month and your primary care provider can assess your further needs. Keep yourself well-hydrated.   Please keep your self quarantine for another 10 days.   Increase activity slowly   Complete by: As directed      Allergies as of 01/07/2020   No Known Allergies     Medication List    STOP taking these medications   OVER THE COUNTER MEDICATION     TAKE these medications   ascorbic acid 500 MG tablet Commonly known as: VITAMIN C Take 1,000 mg by mouth daily.   aspirin EC 81 MG tablet Take 324 mg by mouth daily.   Elderberry 575 MG/5ML Syrp Take 5 mLs by mouth as directed.   fluticasone 50 MCG/ACT nasal spray Commonly known as: FLONASE Place 1 spray into both nostrils daily.   guaiFENesin-dextromethorphan 100-10 MG/5ML syrup Commonly known as: ROBITUSSIN DM Take 10 mLs by mouth every 4 (  four) hours as needed for cough.   Ipratropium-Albuterol 20-100 MCG/ACT Aers respimat Commonly known as: COMBIVENT Inhale 2 puffs into the lungs 4 (four) times daily.   predniSONE 10 MG (21) Tbpk tablet Commonly known as: STERAPRED UNI-PAK 21 TAB Take 4 tablets for 2 days, decrease to 3 tablets for 2 days, decrease to 2 tablets for 2 days and then take 1 tablet daily till you finish a  pack.   Rivaroxaban Stater Pack (15 mg and 20 mg) Commonly known as: XARELTO STARTER PACK Follow package directions: Take one 15mg  tablet by mouth twice a day. On day 22, switch to one 20mg  tablet once a day. Take with food.   sodium chloride 0.65 % Soln nasal spray Commonly known as: OCEAN Place 1 spray into both nostrils as needed for congestion.   Vitamin D-3 125 MCG (5000 UT) Tabs Take 5,000 Units by mouth daily.   zinc sulfate 220 (50 Zn) MG capsule Take 220 mg by mouth daily.            Durable Medical Equipment  (From admission, onward)         Start     Ordered   01/07/20 0929  For home use only DME 3 n 1  Once        01/07/20 0928   01/07/20 0753  For home use only DME oxygen  Once       Question Answer Comment  Length of Need 6 Months   Mode or (Route) Nasal cannula   Liters per Minute 6   Frequency Continuous (stationary and portable oxygen unit needed)   Oxygen conserving device Yes   Oxygen delivery system Gas      01/07/20 0753          No Known Allergies  Consultations:  None  Procedures/Studies: CT ANGIO CHEST PE W OR WO CONTRAST  Result Date: 12/30/2019 CLINICAL DATA:  Suspected PE.  Positive D-dimer. EXAM: CT ANGIOGRAPHY CHEST WITH CONTRAST TECHNIQUE: Multidetector CT imaging of the chest was performed using the standard protocol during bolus administration of intravenous contrast. Multiplanar CT image reconstructions and MIPs were obtained to evaluate the vascular anatomy. CONTRAST:  30mL OMNIPAQUE IOHEXOL 350 MG/ML SOLN COMPARISON:  Chest x-ray December 29, 2019 FINDINGS: Cardiovascular: Signs of subsegmental emboli in the lower lobes. Example seen on image 179 of series 5 in the LEFT lower lobe medially. Small subsegmental embolus suspected also on image 227 of series 5 in the RIGHT lower lobe. Segmental embolus at a branch point in the RIGHT lower lobe on image 135 of series 5 and in the RIGHT upper lobe on image 126 of series 5. Scattered  small emboli throughout the chest also in the LEFT upper lobe on image 86 of series 5 posteriorly as an additional example of embolic disease. Small embolus in the lingular segmental branch on image 41 of series 7 and in in the anterior LEFT upper lobe branch on image 128 of series 5. No pericardial effusion. RV to LV ratio approximately 0.8 with mild straightening of the interventricular septum. Aorta with scattered atherosclerosis.  No aneurysmal dilation. Mediastinum/Nodes: Borderline enlarged lymph nodes scattered throughout the chest, most likely reactive given parenchymal findings. No axillary lymphadenopathy. No thoracic inlet adenopathy. Lungs/Pleura: Diffuse ground-glass and septal thickening worse at the lung bases. Background nodularity is present within the pattern, for instance on image 46 of series 6 in the LEFT upper lobe a 1.6 cm area of more confluent nodularity. No pleural effusion. Airways are  patent. Upper Abdomen: Cholelithiasis. Low-density lesion in the RIGHT hepatic lobe measuring water density likely a small cyst another adjacent to caudate and RIGHT hepatic lobe with similar imaging characteristics. Adrenal glands are normal. Kidneys are incompletely imaged. No acute upper abdominal process to the extent evaluated. Musculoskeletal: No acute musculoskeletal process. Spinal degenerative changes. Review of the MIP images confirms the above findings. IMPRESSION: 1. Signs of subsegmental emboli in the lower lobes, RIGHT upper lobe and lingula. 2. RV to LV ratio approximately 0.8 with mild straightening of the interventricular septum, not currently meeting criteria for RIGHT heart strain, consider attention on follow-up with echocardiography as indicated. 3. Multifocal pneumonia with background nodular pattern in this patient with history of COVID-19. Follow-up may be helpful to assess changes and or resolution. 4. Cholelithiasis. 5. Low-density lesion in the RIGHT hepatic lobe measuring water  density likely a small cyst. 6. Aortic atherosclerosis. Critical Value/emergent results were called by telephone at the time of interpretation on 12/30/2019 at 10:29 am to provider Loletha Grayer , who verbally acknowledged these results. Aortic Atherosclerosis (ICD10-I70.0). Electronically Signed   By: Zetta Bills M.D.   On: 12/30/2019 10:29   US Venous Img Lower Bilateral (DVT)  Result Date: 12/30/2019 CLINICAL DATA:  Pulmonary emboli. EXAM: BILATERAL LOWER EXTREMITY VENOUS DOPPLER ULTRASOUND TECHNIQUE: Gray-scale sonography with graded compression, as well as color Doppler and duplex ultrasound were performed to evaluate the lower extremity deep venous systems from the level of the common femoral vein and including the common femoral, femoral, profunda femoral, popliteal and calf veins including the posterior tibial, peroneal and gastrocnemius veins when visible. The superficial great saphenous vein was also interrogated. Spectral Doppler was utilized to evaluate flow at rest and with distal augmentation maneuvers in the common femoral, femoral and popliteal veins. COMPARISON:  None. FINDINGS: RIGHT LOWER EXTREMITY Common Femoral Vein: No evidence of thrombus. Normal compressibility, respiratory phasicity and response to augmentation. Saphenofemoral Junction: No evidence of thrombus. Normal compressibility and flow on color Doppler imaging. Profunda Femoral Vein: No evidence of thrombus. Normal compressibility and flow on color Doppler imaging. Femoral Vein: No evidence of thrombus. Normal compressibility, respiratory phasicity and response to augmentation. Popliteal Vein: No evidence of thrombus. Normal compressibility, respiratory phasicity and response to augmentation. Calf Veins: No evidence of thrombus. Normal compressibility and flow on color Doppler imaging. Superficial Great Saphenous Vein: No evidence of thrombus. Normal compressibility. Venous Reflux:  None. Other Findings: Small fluid collection  in the right popliteal fossa measuring up to 1.4 cm, likely Baker's cyst. LEFT LOWER EXTREMITY Common Femoral Vein: No evidence of thrombus. Normal compressibility, respiratory phasicity and response to augmentation. Saphenofemoral Junction: No evidence of thrombus. Normal compressibility and flow on color Doppler imaging. Profunda Femoral Vein: No evidence of thrombus. Normal compressibility and flow on color Doppler imaging. Femoral Vein: No evidence of thrombus. Normal compressibility, respiratory phasicity and response to augmentation. Popliteal Vein: No evidence of thrombus. Normal compressibility, respiratory phasicity and response to augmentation. Calf Veins: No evidence of thrombus. Normal compressibility and flow on color Doppler imaging. Superficial Great Saphenous Vein: No evidence of thrombus. Normal compressibility. Venous Reflux:  None. Other Findings: Small fluid collection within the left popliteal fossa measuring up to 1.8 cm, likely Baker's cyst. IMPRESSION: 1. No evidence of deep venous thrombosis in either lower extremity. 2. Small bilateral popliteal fossa fluid collections, likely representing Baker's cysts. Electronically Signed   By: Davina Poke D.O.   On: 12/30/2019 12:15   DG Chest Port 1 View  Result Date:  12/29/2019 CLINICAL DATA:  Fever, shortness of breath. EXAM: PORTABLE CHEST 1 VIEW COMPARISON:  None. FINDINGS: The heart size and mediastinal contours are within normal limits. No pneumothorax or pleural effusion is noted. Multiple airspace opacities are noted throughout both lungs concerning for multifocal pneumonia, potentially due to COVID-19. The visualized skeletal structures are unremarkable. IMPRESSION: Multiple bilateral airspace opacities are noted concerning for multifocal pneumonia, potentially due to COVID-19. Electronically Signed   By: Marijo Conception M.D.   On: 12/29/2019 14:57    Subjective: Patient was feeling better when seen today before discharge. Ready  to go home. No new complaints.   Discharge Exam: Vitals:   01/07/20 0442 01/07/20 0756  BP: (!) 100/45 111/68  Pulse: 72 85  Resp: 17 19  Temp:  97.6 F (36.4 C)  SpO2: 100% 90%   Vitals:   01/06/20 2327 01/07/20 0421 01/07/20 0442 01/07/20 0756  BP: 98/84 (!) 86/56 (!) 100/45 111/68  Pulse: 84 74 72 85  Resp: 18 16 17 19   Temp: 99 F (37.2 C) 98.9 F (37.2 C)  97.6 F (36.4 C)  TempSrc: Oral Oral  Oral  SpO2: 92% 96% 100% 90%  Weight:      Height:        General: Pt is alert, awake, not in acute distress Cardiovascular: RRR, S1/S2 +, no rubs, no gallops Respiratory: CTA bilaterally, no wheezing, no rhonchi Abdominal: Soft, NT, ND, bowel sounds + Extremities: no edema, no cyanosis   The results of significant diagnostics from this hospitalization (including imaging, microbiology, ancillary and laboratory) are listed below for reference.    Microbiology: Recent Results (from the past 240 hour(s))  Blood Culture (routine x 2)     Status: None   Collection Time: 12/29/19  2:19 PM   Specimen: BLOOD  Result Value Ref Range Status   Specimen Description BLOOD LEFT ANTECUBITAL  Final   Special Requests   Final    BOTTLES DRAWN AEROBIC AND ANAEROBIC Blood Culture adequate volume   Culture   Final    NO GROWTH 5 DAYS Performed at [facility], [address], [place], [place] [address]    Report Status 01/03/2020 FINAL  Final  Blood Culture (routine x 2)     Status: None   Collection Time: 12/29/19  2:19 PM   Specimen: BLOOD  Result Value Ref Range Status   Specimen Description BLOOD RIGHT ANTECUBITAL  Final   Special Requests   Final    BOTTLES DRAWN AEROBIC AND ANAEROBIC Blood Culture adequate volume   Culture   Final    NO GROWTH 5 DAYS Performed at [facility], [address] [place], [place] [address]    Report Status 01/03/2020 FINAL  Final  SARS Coronavirus 2 by RT PCR (hospital order, performed in [place] hospital lab)  Nasopharyngeal Nasopharyngeal Swab     Status: Abnormal   Collection Time: 12/29/19  2:20 PM   Specimen: Nasopharyngeal Swab  Result Value Ref Range Status   SARS Coronavirus 2 POSITIVE (A) NEGATIVE Final    Comment: RESULT CALLED TO, READ BACK BY AND VERIFIED WITH: KATIE FERGUSSON @1606  12/29/19 MJU (NOTE) SARS-CoV-2 target nucleic acids are DETECTED  SARS-CoV-2 RNA is generally detectable in upper respiratory specimens  during the acute phase of infection.  Positive results are indicative  of the presence of the identified virus, but do not rule out bacterial infection or co-infection with other pathogens not detected by the test.  Clinical correlation with patient history  and  other diagnostic information is necessary to determine patient infection status.  The expected result is negative.  Fact Sheet for Patients:   StrictlyIdeas.no   Fact Sheet for Healthcare Providers:   BankingDealers.co.za    This test is not yet approved or cleared by the [place] FDA and  has been authorized for detection and/or diagnosis of SARS-CoV-2 by FDA under an Emergency Use Authorization (EUA).  This EUA will remain in effect (meaning this test  can be used) for the duration of  the COVID-19 declaration under Section 564(b)(1) of the Act, 21 U.S.C. section 360-bbb-3(b)(1), unless the authorization is terminated or revoked sooner.  Performed at [facility], [address] [place], [place] [address]      Labs: BNP (last 3 results) Recent Labs    12/29/19 1418  BNP 83.1   Basic Metabolic Panel: Recent Labs  Lab 01/03/20 0817 01/04/20 0445 01/05/20 0502 01/06/20 0540 01/07/20 0530  NA 134* 134* 135 135 135  K 4.8 4.5 5.0 5.4* 4.4  CL 100 102 101 101 96*  CO2 22 23 26 28 28   GLUCOSE 101* 104* 113* 106* 104*  BUN 27* 28* 28* 28* 35*  CREATININE 0.93 0.82 0.93 1.02 1.06  CALCIUM 8.1* 8.2* 8.1* 8.2* 8.3*   Liver  Function Tests: Recent Labs  Lab 01/01/20 0458 01/02/20 0551 01/03/20 0817  AST 38 43* 37  ALT 62* 61* 55*  ALKPHOS 64 62 65  BILITOT 1.0 1.1 1.4*  PROT 5.9* 5.9* 6.4*  ALBUMIN 2.4* 2.4* 2.5*   No results for input(s): LIPASE, AMYLASE in the last 168 hours. No results for input(s): AMMONIA in the last 168 hours. CBC: Recent Labs  Lab 01/01/20 0458 01/01/20 0458 01/02/20 0551 01/02/20 0551 01/03/20 0817 01/04/20 0445 01/05/20 0502 01/06/20 0540 01/07/20 0530  WBC 12.1*   < > 13.2*   < > 20.4* 19.4* 17.2* 13.6* 13.9*  NEUTROABS 11.3*  --  12.5*  --  19.3*  --   --   --   --   HGB 13.4   < > 14.2   < > 15.3 15.3 14.8 14.9 15.9  HCT 39.5   < > 41.0   < > 44.4 42.9 42.9 42.0 45.3  MCV 87.0   < > 86.3   < > 85.4 85.8 85.5 84.7 84.5  PLT 350   < > 282   < > 231 240 259 284 343   < > = values in this interval not displayed.   Cardiac Enzymes: No results for input(s): CKTOTAL, CKMB, CKMBINDEX, TROPONINI in the last 168 hours. BNP: Invalid input(s): POCBNP CBG: Recent Labs  Lab 01/06/20 0752 01/06/20 1133 01/06/20 1609 01/06/20 1955 01/07/20 0756  GLUCAP 92 138* 152* 153* 96   D-Dimer No results for input(s): DDIMER in the last 72 hours. Hgb A1c No results for input(s): HGBA1C in the last 72 hours. Lipid Profile No results for input(s): CHOL, HDL, LDLCALC, TRIG, CHOLHDL, LDLDIRECT in the last 72 hours. Thyroid function studies No results for input(s): TSH, T4TOTAL, T3FREE, THYROIDAB in the last 72 hours.  Invalid input(s): FREET3 Anemia work up No results for input(s): VITAMINB12, FOLATE, FERRITIN, TIBC, IRON, RETICCTPCT in the last 72 hours. Urinalysis No results found for: COLORURINE, APPEARANCEUR, [place], [place], GLUCOSEU, [place], [place], [place], PROTEINUR, UROBILINOGEN, NITRITE, LEUKOCYTESUR Sepsis Labs Invalid input(s): PROCALCITONIN,  WBC,  LACTICIDVEN Microbiology Recent Results (from the past 240 hour(s))  Blood Culture (routine x 2)      Status: None  Collection Time: 12/29/19  2:19 PM   Specimen: BLOOD  Result Value Ref Range Status   Specimen Description BLOOD LEFT ANTECUBITAL  Final   Special Requests   Final    BOTTLES DRAWN AEROBIC AND ANAEROBIC Blood Culture adequate volume   Culture   Final    NO GROWTH 5 DAYS Performed at [facility], [address] [place], [place] [address]    Report Status 01/03/2020 FINAL  Final  Blood Culture (routine x 2)     Status: None   Collection Time: 12/29/19  2:19 PM   Specimen: BLOOD  Result Value Ref Range Status   Specimen Description BLOOD RIGHT ANTECUBITAL  Final   Special Requests   Final    BOTTLES DRAWN AEROBIC AND ANAEROBIC Blood Culture adequate volume   Culture   Final    NO GROWTH 5 DAYS Performed at [facility], [address] [place], [place] [address]    Report Status 01/03/2020 FINAL  Final  SARS Coronavirus 2 by RT PCR (hospital order, performed in [place] hospital lab) Nasopharyngeal Nasopharyngeal Swab     Status: Abnormal   Collection Time: 12/29/19  2:20 PM   Specimen: Nasopharyngeal Swab  Result Value Ref Range Status   SARS Coronavirus 2 POSITIVE (A) NEGATIVE Final    Comment: RESULT CALLED TO, READ BACK BY AND VERIFIED WITH: KATIE FERGUSSON @1606  12/29/19 MJU (NOTE) SARS-CoV-2 target nucleic acids are DETECTED  SARS-CoV-2 RNA is generally detectable in upper respiratory specimens  during the acute phase of infection.  Positive results are indicative  of the presence of the identified virus, but do not rule out bacterial infection or co-infection with other pathogens not detected by the test.  Clinical correlation with patient history and  other diagnostic information is necessary to determine patient infection status.  The expected result is negative.  Fact Sheet for Patients:   StrictlyIdeas.no   Fact Sheet for Healthcare Providers:   BankingDealers.co.za    This test  is not yet approved or cleared by the [place] FDA and  has been authorized for detection and/or diagnosis of SARS-CoV-2 by FDA under an Emergency Use Authorization (EUA).  This EUA will remain in effect (meaning this test  can be used) for the duration of  the COVID-19 declaration under Section 564(b)(1) of the Act, 21 U.S.C. section 360-bbb-3(b)(1), unless the authorization is terminated or revoked sooner.  Performed at [facility], [address] [place], [place] [address]     Time coordinating discharge: Over 30 minutes  SIGNED:  Lorella Nimrod, MD  [facility] 01/07/2020, 9:33 AM  If 7PM-7AM, please contact night-coverage [email or web address]  This record has been created using Systems analyst. Errors have been sought and corrected,but may not always be located. Such creation errors do not reflect on the standard of care.

## 2020-01-07 NOTE — Progress Notes (Addendum)
Received MD order to discharge patient to home with home health and o2, reviewed discharge instructions, home meds, prescriptions and follow up appointments  with patient and patient verbalized understanding.  Also reviewed home o2 usage.

## 2020-01-07 NOTE — Progress Notes (Signed)
SATURATION QUALIFICATIONS: (This note is used to comply with regulatory documentation for home oxygen)  Patient Saturations on Room Air at Rest = 91%  Patient Saturations on Room Air while Ambulating = 80*%  Patient Saturations on 6 Liters of oxygen while Ambulating = 80%  Please briefly explain why patient needs home oxygen: patient started at 88% on 6 liters when standing and dropped to 80% walking in room had to increase to o2 to 10L and return to bed.  Patient recovered on 6L at 91%, is now on 6L sats 93-94.

## 2020-01-07 NOTE — TOC Transition Note (Signed)
Transition of Care [facility]) - CM/SW Discharge Note   Patient Details  Name: CHANANYA CANIZALEZ MRN: [id number] Date of Birth: January 04, 1951  Transition of Care [facility]) CM/SW Contact:  Shelbie Hutching, RN Phone Number: 01/07/2020, 10:51 AM   Clinical Narrative:    Patient is medically cleared for discharge home today.  Patient needs home O2.  [employer] will provide oxygen and deliver tanks to the room and then a home set up to the house.  Patient and wife agree that he needs PT at discharge.  [place] with [facility] has accepted referral for PT and OT.  Wife will pick patient up today.  Patient has a walker at home and a wheelchair and they do not want a 3 in 1.  No other needs at this time.    Final next level of care: Major Barriers to Discharge: Barriers Resolved   Patient Goals and CMS Choice Patient states their goals for this hospitalization and ongoing recovery are:: To get home and have oxygen at home if needed CMS Medicare.gov Compare Post Acute Care list provided to:: Patient Choice offered to / list presented to : Patient, Spouse  Discharge Placement                       Discharge Plan and Services   Discharge Planning Services: CM Consult Post Acute Care Choice: NA          DME Arranged: Oxygen DME Agency: [employer] Date DME Agency Contacted: 01/07/20   Representative spoke with at DME Agency: [place]: PT, OT [place] Agency: [place] Date [place]: 01/07/20 Time [place]: 2919 Representative spoke with at [place]: [place]  Social Determinants of Health ([place]) Interventions     Readmission Risk Interventions No flowsheet data found.

## 2020-02-03 ENCOUNTER — Ambulatory Visit: Payer: Self-pay | Admitting: Dermatology

## 2020-03-24 ENCOUNTER — Encounter: Payer: Self-pay | Admitting: Dermatology

## 2020-03-24 ENCOUNTER — Other Ambulatory Visit: Payer: Self-pay

## 2020-03-24 ENCOUNTER — Ambulatory Visit (INDEPENDENT_AMBULATORY_CARE_PROVIDER_SITE_OTHER): Payer: BC Managed Care – PPO | Admitting: Dermatology

## 2020-03-24 DIAGNOSIS — Z85828 Personal history of other malignant neoplasm of skin: Secondary | ICD-10-CM | POA: Diagnosis not present

## 2020-03-24 DIAGNOSIS — L578 Other skin changes due to chronic exposure to nonionizing radiation: Secondary | ICD-10-CM | POA: Diagnosis not present

## 2020-03-24 DIAGNOSIS — L820 Inflamed seborrheic keratosis: Secondary | ICD-10-CM

## 2020-03-24 NOTE — Progress Notes (Signed)
   Follow-Up Visit   Subjective  Brett Maldonado is a 69 y.o. male who presents for the following: spot (under left eye, x 1 month, itchy).   The following portions of the chart were reviewed this encounter and updated as appropriate:      Review of Systems:  No other skin or systemic complaints except as noted in HPI or Assessment and Plan.  Objective  Well appearing patient in no apparent distress; mood and affect are within normal limits.  A focused examination was performed including face. Relevant physical exam findings are noted in the Assessment and Plan.  Objective  Left Lower Eyelid : 5.45mm pink waxy flat papule   Assessment & Plan    Actinic Damage - chronic, secondary to cumulative UV radiation exposure/sun exposure over time - diffuse scaly erythematous macules with underlying dyspigmentation - Recommend daily broad spectrum sunscreen SPF 30+ to sun-exposed areas, reapply every 2 hours as needed.  - Call for new or changing lesions.  History of Basal Cell Carcinoma of the Skin - No evidence of recurrence today - Recommend regular full body skin exams - Recommend daily broad spectrum sunscreen SPF 30+ to sun-exposed areas, reapply every 2 hours as needed.  - Call if any new or changing lesions are noted between office visits   Inflamed seborrheic keratosis Left Lower Eyelid   Destruction of lesion - Left Lower Eyelid   Destruction method: cryotherapy   Informed consent: discussed and consent obtained   Lesion destroyed using liquid nitrogen: Yes   Region frozen until ice ball extended beyond lesion: Yes   Outcome: patient tolerated procedure well with no complications   Post-procedure details: wound care instructions given    Return if symptoms worsen or fail to improve.  IJamesetta Orleans, CMA, am acting as scribe for Brendolyn Patty, MD .  Documentation: I have reviewed the above documentation for accuracy and completeness, and I agree with the  above.  Brendolyn Patty MD

## 2020-03-24 NOTE — Patient Instructions (Signed)
Cryotherapy Aftercare  . Wash gently with soap and water everyday.   . Apply Vaseline and Band-Aid daily until healed.  

## 2022-05-31 ENCOUNTER — Ambulatory Visit (INDEPENDENT_AMBULATORY_CARE_PROVIDER_SITE_OTHER): Payer: BLUE CROSS/BLUE SHIELD | Admitting: Dermatology

## 2022-05-31 ENCOUNTER — Encounter: Payer: Self-pay | Admitting: Dermatology

## 2022-05-31 VITALS — BP 154/85 | HR 68

## 2022-05-31 DIAGNOSIS — L821 Other seborrheic keratosis: Secondary | ICD-10-CM | POA: Diagnosis not present

## 2022-05-31 DIAGNOSIS — L578 Other skin changes due to chronic exposure to nonionizing radiation: Secondary | ICD-10-CM | POA: Diagnosis not present

## 2022-05-31 DIAGNOSIS — Z85828 Personal history of other malignant neoplasm of skin: Secondary | ICD-10-CM

## 2022-05-31 DIAGNOSIS — L409 Psoriasis, unspecified: Secondary | ICD-10-CM | POA: Diagnosis not present

## 2022-05-31 DIAGNOSIS — L820 Inflamed seborrheic keratosis: Secondary | ICD-10-CM | POA: Diagnosis not present

## 2022-05-31 DIAGNOSIS — L814 Other melanin hyperpigmentation: Secondary | ICD-10-CM

## 2022-05-31 MED ORDER — CLOBETASOL PROPIONATE 0.05 % EX SOLN: CUTANEOUS | 2 refills | Status: AC

## 2022-05-31 NOTE — Progress Notes (Signed)
Follow-Up Visit   Subjective  Brett Maldonado is a 72 y.o. male who presents for the following: Skin Problem (Left temple. Itching, burns at times. Dur: 4-6 months).  H/o BCC.  He also has itchy area in scalp, stays dry and scaly.  The patient has spots, moles and lesions to be evaluated, some may be new or changing and the patient has concerns that these could be cancer.   The following portions of the chart were reviewed this encounter and updated as appropriate:      Review of Systems: No other skin or systemic complaints except as noted in HPI or Assessment and Plan.   Objective  Well appearing patient in no apparent distress; mood and affect are within normal limits.  A focused examination was performed including face. Relevant physical exam findings are noted in the Assessment and Plan.  left temple x1 Erythematous keratotic or waxy stuck-on papule  Mid Occipital Scalp Pink scaly patch   Assessment & Plan   Actinic Damage - chronic, secondary to cumulative UV radiation exposure/sun exposure over time - diffuse scaly erythematous macules with underlying dyspigmentation - Recommend daily broad spectrum sunscreen SPF 30+ to sun-exposed areas, reapply every 2 hours as needed.  - Recommend staying in the shade or wearing long sleeves, sun glasses (UVA+UVB protection) and wide brim hats (4-inch brim around the entire circumference of the hat). - Call for new or changing lesions.   History of Basal Cell Carcinoma of the Skin. Right ant. nasal alar crease, left mid lat neck - No evidence of recurrence today - Recommend regular full body skin exams - Recommend daily broad spectrum sunscreen SPF 30+ to sun-exposed areas, reapply every 2 hours as needed.  - Call if any new or changing lesions are noted between office visits   Seborrheic Keratoses - Stuck-on, waxy, tan-brown papules and/or plaques  - Benign-appearing - Discussed benign etiology and prognosis. - Observe -  Call for any changes  Lentigines - Scattered tan macules - Due to sun exposure - Benign-appearing, observe - Recommend daily broad spectrum sunscreen SPF 30+ to sun-exposed areas, reapply every 2 hours as needed. - Call for any changes  Inflamed seborrheic keratosis left temple x1  Symptomatic, irritating, patient would like treated.  RTC if doesn't clear    Destruction of lesion - left temple x1  Destruction method: cryotherapy   Informed consent: discussed and consent obtained   Lesion destroyed using liquid nitrogen: Yes   Region frozen until ice ball extended beyond lesion: Yes   Outcome: patient tolerated procedure well with no complications   Post-procedure details: wound care instructions given   Additional details:  Prior to procedure, discussed risks of blister formation, small wound, skin dyspigmentation, or rare scar following cryotherapy. Recommend Vaseline ointment to treated areas while healing.   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 Condition is bothersome/symptomatic for patient. Currently flared.   Counseling on psoriasis and coordination of care  psoriasis is a chronic non-curable, but treatable genetic/hereditary disease that may have other systemic features affecting other organ systems such as joints (Psoriatic Arthritis). It is associated with an increased risk of inflammatory bowel disease, heart disease, non-alcoholic fatty liver disease, and depression.  Treatments include light and laser treatments; topical medications; and systemic medications including oral and injectables.  Start Clobetasol solution once or twice daily to affected areas on scalp as needed  Recommend using medicated shampoo as directed. Sample H&S  given  Topical steroids (such as triamcinolone, fluocinolone, fluocinonide, mometasone, clobetasol, halobetasol, betamethasone, hydrocortisone) can cause thinning  and lightening of the skin if they are used for too long in the same area. Your physician has selected the right strength medicine for your problem and area affected on the body. Please use your medication only as directed by your physician to prevent side effects.    clobetasol (TEMOVATE) 0.05 % external solution - Mid Occipital Scalp Apply once or twice daily to scalp as needed   Return if symptoms worsen or fail to improve.  I, Emelia Salisbury, CMA, am acting as scribe for Brendolyn Patty, MD.  Documentation: I have reviewed the above documentation for accuracy and completeness, and I agree with the above.  Brendolyn Patty MD

## 2022-05-31 NOTE — Patient Instructions (Addendum)
Cryotherapy Aftercare  Wash gently with soap and water everyday.   Apply Vaseline daily until healed.    Scalp: Start Clobetasol solution once or twice daily to affected areas on scalp as needed  Recommend using medicated shampoo as directed. Lather on scalp, leave on 8-10 minutes, rinse out.  Topical steroids (such as triamcinolone, fluocinolone, fluocinonide, mometasone, clobetasol, halobetasol, betamethasone, hydrocortisone) can cause thinning and lightening of the skin if they are used for too long in the same area. Your physician has selected the right strength medicine for your problem and area affected on the body. Please use your medication only as directed by your physician to prevent side effects.     Seborrheic Keratosis  What causes seborrheic keratoses? Seborrheic keratoses are harmless, common skin growths that first appear during adult life.  As time goes by, more growths appear.  Some people may develop a large number of them.  Seborrheic keratoses appear on both covered and uncovered body parts.  They are not caused by sunlight.  The tendency to develop seborrheic keratoses can be inherited.  They vary in color from skin-colored to gray, brown, or even black.  They can be either smooth or have a rough, warty surface.   Seborrheic keratoses are superficial and look as if they were stuck on the skin.  Under the microscope this type of keratosis looks like layers upon layers of skin.  That is why at times the top layer may seem to fall off, but the rest of the growth remains and re-grows.    Treatment Seborrheic keratoses do not need to be treated, but can easily be removed in the office.  Seborrheic keratoses often cause symptoms when they rub on clothing or jewelry.  Lesions can be in the way of shaving.  If they become inflamed, they can cause itching, soreness, or burning.  Removal of a seborrheic keratosis can be accomplished by freezing, burning, or surgery. If any spot  bleeds, scabs, or grows rapidly, please return to have it checked, as these can be an indication of a skin cancer.   Due to recent changes in healthcare laws, you may see results of your pathology and/or laboratory studies on MyChart before the doctors have had a chance to review them. We understand that in some cases there may be results that are confusing or concerning to you. Please understand that not all results are received at the same time and often the doctors may need to interpret multiple results in order to provide you with the best plan of care or course of treatment. Therefore, we ask that you please give [place] 2 business days to thoroughly review all your results before contacting the office for clarification. Should we see a critical lab result, you will be contacted sooner.   If You Need Anything After Your Visit  If you have any questions or concerns for your doctor, please call our main line at [phone] and press option 4 to reach your doctor's medical assistant. If no one answers, please leave a voicemail as directed and we will return your call as soon as possible. Messages left after 4 pm will be answered the following business day.   You may also send [place] a message via [place]. We typically respond to MyChart messages within 1-2 business days.  For prescription refills, please ask your pharmacy to contact our office. Our fax number is [phone].  If you have an urgent issue when the clinic is closed that cannot wait until the  next business day, you can page your doctor at the number below.    Please note that while we do our best to be available for urgent issues outside of office hours, we are not available 24/7.   If you have an urgent issue and are unable to reach [place], you may choose to seek medical care at your doctor's office, retail clinic, urgent care center, or emergency room.  If you have a medical emergency, please immediately call 911 or go to the emergency  department.  Pager Numbers  - Dr. Nehemiah Massed: [phone]  - Dr. Laurence Ferrari: [phone]  - Dr. Nicole Kindred: [phone]  In the event of inclement weather, please call our main line at [phone] for an update on the status of any delays or closures.  Dermatology Medication Tips: Please keep the boxes that topical medications come in in order to help keep track of the instructions about where and how to use these. Pharmacies typically print the medication instructions only on the boxes and not directly on the medication tubes.   If your medication is too expensive, please contact our office at [phone] option 4 or send [place] a message through [place].   We are unable to tell what your co-pay for medications will be in advance as this is different depending on your insurance coverage. However, we may be able to find a substitute medication at lower cost or fill out paperwork to get insurance to cover a needed medication.   If a prior authorization is required to get your medication covered by your insurance company, please allow [place] 1-2 business days to complete this process.  Drug prices often vary depending on where the prescription is filled and some pharmacies may offer cheaper prices.  The website www.goodrx.com contains coupons for medications through different pharmacies. The prices here do not account for what the cost may be with help from insurance (it may be cheaper with your insurance), but the website can give you the price if you did not use any insurance.  - You can print the associated coupon and take it with your prescription to the pharmacy.  - You may also stop by our office during regular business hours and pick up a GoodRx coupon card.  - If you need your prescription sent electronically to a different pharmacy, notify our office through [facility] or by phone at [phone] option 4.     Si Usted Necesita Algo Despus de Su Visita  Tambin puede enviarnos un  mensaje a travs de Pharmacist, community. Por lo general respondemos a los mensajes de MyChart en el transcurso de 1 a 2 das hbiles.  Para renovar recetas, por favor pida a su farmacia que se ponga en contacto con nuestra oficina. Harland Dingwall de fax es [place] [phone].  Si tiene un asunto urgente cuando la clnica est cerrada y que no puede esperar hasta el siguiente da hbil, puede llamar/localizar a su doctor(a) al nmero que aparece a continuacin.   Por favor, tenga en cuenta que aunque hacemos todo lo posible para estar disponibles para asuntos urgentes fuera del horario de [place], no estamos disponibles las 24 horas del da, los 7 das de la [place].   Si tiene un problema urgente y no puede comunicarse con nosotros, puede optar por buscar atencin mdica  en el consultorio de su doctor(a), en una clnica privada, en un centro de atencin urgente o en una sala de emergencias.  Si tiene una emergencia mdica, por favor llame inmediatamente al 911  o vaya a la sala de emergencias.  Nmeros de bper  - Dr. Nehemiah Massed: [phone]  - Dra. Moye: [phone]  - Dra. Nicole Kindred: [phone]  En caso de inclemencias del [place], por favor llame a Johnsie Kindred principal al [phone] para una actualizacin sobre el [place] de cualquier retraso o cierre.  Consejos para la medicacin en dermatologa: Por favor, guarde las cajas en las que vienen los medicamentos de uso tpico para ayudarle a seguir las instrucciones sobre dnde y cmo usarlos. Las farmacias generalmente imprimen las instrucciones del medicamento slo en las cajas y no directamente en los tubos del [place].   Si su medicamento es muy caro, por favor, pngase en contacto con Zigmund Daniel llamando al [phone] y presione la opcin 4 o envenos un mensaje a travs de Pharmacist, community.   No podemos decirle cul ser su copago por los medicamentos por adelantado ya que esto es diferente dependiendo de la cobertura de su seguro. Sin embargo,  es posible que podamos encontrar un medicamento sustituto a Electrical engineer un formulario para que el seguro cubra el medicamento que se considera necesario.   Si se requiere una autorizacin previa para que su compaa de seguros [place] su medicamento, por favor permtanos de 1 a 2 das hbiles para completar este proceso.  Los precios de los medicamentos varan con frecuencia dependiendo del Environmental consultant de dnde se surte la receta y alguna farmacias pueden ofrecer precios ms baratos.  El sitio web www.goodrx.com tiene cupones para medicamentos de Airline pilot. Los precios aqu no tienen en cuenta lo que podra costar con la ayuda del seguro (puede ser ms barato con su seguro), pero el sitio web puede darle el precio si no utiliz Research scientist (physical sciences).  - Puede imprimir el cupn correspondiente y llevarlo con su receta a la farmacia.  - Tambin puede pasar por nuestra oficina durante el horario de atencin regular y Charity fundraiser una tarjeta de cupones de GoodRx.  - Si necesita que su receta se enve electrnicamente a una farmacia diferente, informe a nuestra oficina a travs de MyChart de [place] o por telfono llamando al [phone] y presione la opcin 4.

## 2023-05-16 ENCOUNTER — Ambulatory Visit: Payer: Medicare HMO | Admitting: Dermatology

## 2023-05-16 DIAGNOSIS — L578 Other skin changes due to chronic exposure to nonionizing radiation: Secondary | ICD-10-CM | POA: Diagnosis not present

## 2023-05-16 DIAGNOSIS — D492 Neoplasm of unspecified behavior of bone, soft tissue, and skin: Secondary | ICD-10-CM | POA: Diagnosis not present

## 2023-05-16 DIAGNOSIS — L820 Inflamed seborrheic keratosis: Secondary | ICD-10-CM | POA: Diagnosis not present

## 2023-05-16 DIAGNOSIS — W908XXA Exposure to other nonionizing radiation, initial encounter: Secondary | ICD-10-CM | POA: Diagnosis not present

## 2023-05-16 DIAGNOSIS — C44310 Basal cell carcinoma of skin of unspecified parts of face: Secondary | ICD-10-CM

## 2023-05-16 DIAGNOSIS — L821 Other seborrheic keratosis: Secondary | ICD-10-CM

## 2023-05-16 DIAGNOSIS — Z85828 Personal history of other malignant neoplasm of skin: Secondary | ICD-10-CM

## 2023-05-16 DIAGNOSIS — D489 Neoplasm of uncertain behavior, unspecified: Secondary | ICD-10-CM

## 2023-05-16 NOTE — Progress Notes (Signed)
Follow-Up Visit   Subjective  Brett Maldonado is a 73 y.o. male who presents for the following:  Patient here today concerning a itchy spot at left temple. He reports that spot was previously treated with Ln2. Patient also reports a spot he would like checked at right chest.   Patient has history of BCC.   The patient has spots, moles and lesions to be evaluated, some may be new or changing and the patient may have concern these could be cancer.   The following portions of the chart were reviewed this encounter and updated as appropriate: medications, allergies, medical history  Review of Systems:  No other skin or systemic complaints except as noted in HPI or Assessment and Plan.  Objective  Well appearing patient in no apparent distress; mood and affect are within normal limits.   A focused examination was performed of the following areas: Face, right chest   Relevant exam findings are noted in the Assessment and Plan.  right chest x 1 Erythematous stuck-on, waxy papule or plaque Left Temple 0.6 cm thin pink papule with central crusting    Assessment & Plan   SEBORRHEIC KERATOSIS - Stuck-on, waxy, tan-brown papules and/or plaques  - Benign-appearing - Discussed benign etiology and prognosis. - Observe - Call for any changes  ACTINIC DAMAGE - chronic, secondary to cumulative UV radiation exposure/sun exposure over time - diffuse scaly erythematous macules with underlying dyspigmentation - Recommend daily broad spectrum sunscreen SPF 30+ to sun-exposed areas, reapply every 2 hours as needed.  - Recommend staying in the shade or wearing long sleeves, sun glasses (UVA+UVB protection) and wide brim hats (4-inch brim around the entire circumference of the hat). - Call for new or changing lesions.  HISTORY OF BASAL CELL CARCINOMA OF THE SKIN 12/2017  Right anterior to the nasal alar crease  11/2018 left mid lateral neck - No evidence of recurrence today - Recommend  regular full body skin exams - Recommend daily broad spectrum sunscreen SPF 30+ to sun-exposed areas, reapply every 2 hours as needed.  - Call if any new or changing lesions are noted between office visits    INFLAMED SEBORRHEIC KERATOSIS right chest x 1 Symptomatic, irritating, patient would like treated. Destruction of lesion - right chest x 1  Destruction method: cryotherapy   Informed consent: discussed and consent obtained   Lesion destroyed using liquid nitrogen: Yes   Region frozen until ice ball extended beyond lesion: Yes   Outcome: patient tolerated procedure well with no complications   Post-procedure details: wound care instructions given   Additional details:  Prior to procedure, discussed risks of blister formation, small wound, skin dyspigmentation, or rare scar following cryotherapy. Recommend Vaseline ointment to treated areas while healing.  NEOPLASM OF UNCERTAIN BEHAVIOR Left Temple Epidermal / dermal shaving  Lesion diameter (cm):  0.6 Informed consent: discussed and consent obtained   Patient was prepped and draped in usual sterile fashion: Area prepped with alcohol. Anesthesia: the lesion was anesthetized in a standard fashion   Anesthetic:  1% lidocaine w/ epinephrine 1-100,000 buffered w/ 8.4% NaHCO3 Instrument used: flexible razor blade   Hemostasis achieved with: pressure, aluminum chloride and electrodesiccation   Outcome: patient tolerated procedure well    Destruction of lesion  Destruction method: electrodesiccation and curettage   Informed consent: discussed and consent obtained   Curettage performed in three different directions: Yes   Electrodesiccation performed over the curetted area: Yes   Final wound size (cm):  0.8 Hemostasis achieved  with:  pressure, aluminum chloride and electrodesiccation Outcome: patient tolerated procedure well with no complications   Post-procedure details: wound care instructions given   Additional details:   Mupirocin ointment and Bandaid applied  Specimen 1 - Surgical pathology Differential Diagnosis: Isk r/o scc/bcc   Check Margins: No Isk r/o scc/bcc   Return for ubse hx of bcc in next several months .  I, Asher Muir, CMA, am acting as scribe for Willeen Niece, MD.   Documentation: I have reviewed the above documentation for accuracy and completeness, and I agree with the above.  Willeen Niece, MD

## 2023-05-16 NOTE — Patient Instructions (Addendum)
Biopsy Wound Care Instructions  Leave the original bandage on for 24 hours if possible.  If the bandage becomes soaked or soiled before that time, it is OK to remove it and examine the wound.  A small amount of post-operative bleeding is normal.  If excessive bleeding occurs, remove the bandage, place gauze over the site and apply continuous pressure (no peeking) over the area for 30 minutes. If this does not work, please call our clinic as soon as possible or page your doctor if it is after hours.   Once a day, cleanse the wound with soap and water. It is fine to shower. If a thick crust develops you may use a Q-tip dipped into dilute hydrogen peroxide (mix 1:1 with water) to dissolve it.  Hydrogen peroxide can slow the healing process, so use it only as needed.    After washing, apply petroleum jelly (Vaseline) or an antibiotic ointment if your doctor prescribed one for you, followed by a bandage.    For best healing, the wound should be covered with a layer of ointment at all times. If you are not able to keep the area covered with a bandage to hold the ointment in place, this may mean re-applying the ointment several times a day.  Continue this wound care until the wound has healed and is no longer open.   Itching and mild discomfort is normal during the healing process. However, if you develop pain or severe itching, please call our office.   If you have any discomfort, you can take Tylenol (acetaminophen) or ibuprofen as directed on the bottle. (Please do not take these if you have an allergy to them or cannot take them for another reason).  Some redness, tenderness and white or yellow material in the wound is normal healing.  If the area becomes very sore and red, or develops a thick yellow-green material (pus), it may be infected; please notify us.    If you have stitches, return to clinic as directed to have the stitches removed. You will continue wound care for 2-3 days after the stitches  are removed.   Wound healing continues for up to one year following surgery. It is not unusual to experience pain in the scar from time to time during the interval.  If the pain becomes severe or the scar thickens, you should notify the office.    A slight amount of redness in a scar is expected for the first six months.  After six months, the redness will fade and the scar will soften and fade.  The color difference becomes less noticeable with time.  If there are any problems, return for a post-op surgery check at your earliest convenience.  To improve the appearance of the scar, you can use silicone scar gel, cream, or sheets (such as Mederma or Serica) every night for up to one year. These are available over the counter (without a prescription).  Please call our office at [phone] for any questions or concerns.   Seborrheic Keratosis  What causes seborrheic keratoses? Seborrheic keratoses are harmless, common skin growths that first appear during adult life.  As time goes by, more growths appear.  Some people may develop a large number of them.  Seborrheic keratoses appear on both covered and uncovered body parts.  They are not caused by sunlight.  The tendency to develop seborrheic keratoses can be inherited.  They vary in color from skin-colored to gray, brown, or even black.  They can be  either smooth or have a rough, warty surface.   Seborrheic keratoses are superficial and look as if they were stuck on the skin.  Under the microscope this type of keratosis looks like layers upon layers of skin.  That is why at times the top layer may seem to fall off, but the rest of the growth remains and re-grows.    Treatment Seborrheic keratoses do not need to be treated, but can easily be removed in the office.  Seborrheic keratoses often cause symptoms when they rub on clothing or jewelry.  Lesions can be in the way of shaving.  If they become inflamed, they can cause itching, soreness, or  burning.  Removal of a seborrheic keratosis can be accomplished by freezing, burning, or surgery. If any spot bleeds, scabs, or grows rapidly, please return to have it checked, as these can be an indication of a skin cancer.   Cryotherapy Aftercare  Wash gently with soap and water everyday.   Apply Vaseline and Band-Aid daily until healed.    Due to recent changes in healthcare laws, you may see results of your pathology and/or laboratory studies on MyChart before the doctors have had a chance to review them. We understand that in some cases there may be results that are confusing or concerning to you. Please understand that not all results are received at the same time and often the doctors may need to interpret multiple results in order to provide you with the best plan of care or course of treatment. Therefore, we ask that you please give [place] 2 business days to thoroughly review all your results before contacting the office for clarification. Should we see a critical lab result, you will be contacted sooner.   If You Need Anything After Your Visit  If you have any questions or concerns for your doctor, please call our main line at [phone] and press option 4 to reach your doctor's medical assistant. If no one answers, please leave a voicemail as directed and we will return your call as soon as possible. Messages left after 4 pm will be answered the following business day.   You may also send [place] a message via MyChart. We typically respond to MyChart messages within 1-2 business days.  For prescription refills, please ask your pharmacy to contact our office. Our fax number is [phone].  If you have an urgent issue when the clinic is closed that cannot wait until the next business day, you can page your doctor at the number below.    Please note that while we do our best to be available for urgent issues outside of office hours, we are not available 24/7.   If you have an urgent issue  and are unable to reach [place], you may choose to seek medical care at your doctor's office, retail clinic, urgent care center, or emergency room.  If you have a medical emergency, please immediately call 911 or go to the emergency department.  Pager Numbers  - Dr. Gwen Pounds: [phone]  - Dr. Roseanne Reno: [phone]  - Dr. Katrinka Blazing: [phone]   In the event of inclement weather, please call our main line at [phone] for an update on the status of any delays or closures.  Dermatology Medication Tips: Please keep the boxes that topical medications come in in order to help keep track of the instructions about where and how to use these. Pharmacies typically print the medication instructions only on the boxes and not directly on the medication tubes.  If your medication is too expensive, please contact our office at [phone] option 4 or send [place] a message through MyChart.   We are unable to tell what your co-pay for medications will be in advance as this is different depending on your insurance coverage. However, we may be able to find a substitute medication at lower cost or fill out paperwork to get insurance to cover a needed medication.   If a prior authorization is required to get your medication covered by your insurance company, please allow [place] 1-2 business days to complete this process.  Drug prices often vary depending on where the prescription is filled and some pharmacies may offer cheaper prices.  The website www.goodrx.com contains coupons for medications through different pharmacies. The prices here do not account for what the cost may be with help from insurance (it may be cheaper with your insurance), but the website can give you the price if you did not use any insurance.  - You can print the associated coupon and take it with your prescription to the pharmacy.  - You may also stop by our office during regular business hours and pick up a GoodRx coupon card.  - If you  need your prescription sent electronically to a different pharmacy, notify our office through [facility] or by phone at [phone] option 4.     Si Usted Necesita Algo Despus de Su Visita  Tambin puede enviarnos un mensaje a travs de Clinical cytogeneticist. Por lo general respondemos a los mensajes de MyChart en el transcurso de 1 a 2 das hbiles.  Para renovar recetas, por favor pida a su farmacia que se ponga en contacto con nuestra oficina. Annie Sable de fax es [place] [phone].  Si tiene un asunto urgente cuando la clnica est cerrada y que no puede esperar hasta el siguiente da hbil, puede llamar/localizar a su doctor(a) al nmero que aparece a continuacin.   Por favor, tenga en cuenta que aunque hacemos todo lo posible para estar disponibles para asuntos urgentes fuera del horario de [place], no estamos disponibles las 24 horas del da, los 7 [address]  [address] de la [place].   Si tiene un problema urgente y no puede comunicarse con nosotros, puede optar por buscar atencin mdica  en el consultorio de su doctor(a), en una clnica privada, en un centro de atencin urgente o en una sala de emergencias.  Si tiene Engineer, drilling, por favor llame inmediatamente al 911 o vaya a la sala de emergencias.  Nmeros de bper  - Dr. Gwen Pounds: [phone]  - Dra. Roseanne Reno: [phone]  - Dr. Katrinka Blazing: [phone]   En caso de inclemencias del tiempo, por favor llame a Lacy Duverney principal al [phone] para una actualizacin sobre el [place] de cualquier retraso o cierre.  Consejos para la medicacin en dermatologa: Por favor, guarde las cajas en las que vienen los medicamentos de uso tpico para ayudarle a seguir las instrucciones sobre dnde y cmo usarlos. Las farmacias generalmente imprimen las instrucciones del medicamento slo en las cajas y no directamente en los tubos del [place].   Si su medicamento es muy caro, por favor, pngase en contacto con Rolm Gala llamando al  [phone] y presione la opcin 4 o envenos un mensaje a travs de Clinical cytogeneticist.   No podemos decirle cul ser su copago por los medicamentos por adelantado ya que esto es diferente dependiendo de la cobertura de su seguro. Sin embargo, es posible que podamos encontrar un medicamento sustituto a Audiological scientist  un formulario para que el seguro cubra el medicamento que se considera necesario.   Si se requiere una autorizacin previa para que su compaa de seguros [place] su medicamento, por favor permtanos de 1 a 2 das hbiles para completar [address]  Los precios de los medicamentos varan con frecuencia dependiendo del Environmental consultant de dnde se surte la receta y alguna farmacias pueden ofrecer precios ms baratos.  El sitio web www.goodrx.com tiene cupones para medicamentos de Health and safety inspector. Los precios aqu no tienen en cuenta lo que podra costar con la ayuda del seguro (puede ser ms barato con su seguro), pero el sitio web puede darle el precio si no utiliz Tourist information centre manager.  - Puede imprimir el cupn correspondiente y llevarlo con su receta a la farmacia.  - Tambin puede pasar por nuestra oficina durante el horario de atencin regular y Education officer, museum una tarjeta de cupones de GoodRx.  - Si necesita que su receta se enve electrnicamente a una farmacia diferente, informe a nuestra oficina a travs de MyChart de [place] o por telfono llamando al [phone] y presione la opcin 4.

## 2023-05-17 LAB — SURGICAL PATHOLOGY

## 2023-05-21 ENCOUNTER — Telehealth: Payer: Self-pay

## 2023-05-21 NOTE — Telephone Encounter (Signed)
-----   Message from Willeen Niece sent at 05/21/2023 11:28 AM EST ----- 1. Skin, left temple :       BASAL CELL CARCINOMA, NODULAR PATTERN    BCC skin cancer- already treated with EDC at time of biopsy  - please call patient

## 2023-05-21 NOTE — Telephone Encounter (Signed)
Tried to call patient with biopsy results. No answer and voicemail full.

## 2023-05-22 ENCOUNTER — Telehealth: Payer: Self-pay

## 2023-05-22 NOTE — Telephone Encounter (Signed)
-----   Message from Willeen Niece sent at 05/21/2023 11:28 AM EST ----- 1. Skin, left temple :       BASAL CELL CARCINOMA, NODULAR PATTERN    BCC skin cancer- already treated with EDC at time of biopsy  - please call patient

## 2023-05-22 NOTE — Telephone Encounter (Signed)
Advised pt of bx result/sh ?

## 2023-11-13 ENCOUNTER — Ambulatory Visit: Payer: BLUE CROSS/BLUE SHIELD | Admitting: Dermatology

## 2024-04-08 ENCOUNTER — Encounter: Payer: Self-pay | Admitting: Dermatology

## 2024-04-08 ENCOUNTER — Ambulatory Visit: Admitting: Dermatology

## 2024-04-08 DIAGNOSIS — D229 Melanocytic nevi, unspecified: Secondary | ICD-10-CM

## 2024-04-08 DIAGNOSIS — D1801 Hemangioma of skin and subcutaneous tissue: Secondary | ICD-10-CM

## 2024-04-08 DIAGNOSIS — D239 Other benign neoplasm of skin, unspecified: Secondary | ICD-10-CM

## 2024-04-08 DIAGNOSIS — L570 Actinic keratosis: Secondary | ICD-10-CM

## 2024-04-08 DIAGNOSIS — L821 Other seborrheic keratosis: Secondary | ICD-10-CM

## 2024-04-08 DIAGNOSIS — Z85828 Personal history of other malignant neoplasm of skin: Secondary | ICD-10-CM

## 2024-04-08 DIAGNOSIS — L719 Rosacea, unspecified: Secondary | ICD-10-CM

## 2024-04-08 DIAGNOSIS — Z1283 Encounter for screening for malignant neoplasm of skin: Secondary | ICD-10-CM

## 2024-04-08 DIAGNOSIS — L853 Xerosis cutis: Secondary | ICD-10-CM

## 2024-04-08 DIAGNOSIS — L814 Other melanin hyperpigmentation: Secondary | ICD-10-CM

## 2024-04-08 DIAGNOSIS — L578 Other skin changes due to chronic exposure to nonionizing radiation: Secondary | ICD-10-CM

## 2024-04-08 MED ORDER — METRONIDAZOLE 0.75 % EX GEL: CUTANEOUS | 5 refills | Status: AC

## 2024-04-08 NOTE — Progress Notes (Signed)
 Follow-Up Visit   Subjective  Brett Maldonado is a 73 y.o. male who presents for the following: Skin Cancer Screening and Upper Body Skin Exam. Hx of BCCs.   The patient presents for Upper Body Skin Exam (UBSE) for skin cancer screening and mole check. The patient has spots, moles and lesions to be evaluated, some may be new or changing and the patient may have concern these could be cancer.    The following portions of the chart were reviewed this encounter and updated as appropriate: medications, allergies, medical history  Review of Systems:  No other skin or systemic complaints except as noted in HPI or Assessment and Plan.  Objective  Well appearing patient in no apparent distress; mood and affect are within normal limits.  All skin waist up examined. Relevant physical exam findings are noted in the Assessment and Plan.  Left Malar Cheek x1 Erythematous thin papules/macules with gritty scale.   Assessment & Plan   AK (ACTINIC KERATOSIS) Left Malar Cheek x1 Actinic keratoses are precancerous spots that appear secondary to cumulative UV radiation exposure/sun exposure over time. They are chronic with expected duration over 1 year. A portion of actinic keratoses will progress to squamous cell carcinoma of the skin. It is not possible to reliably predict which spots will progress to skin cancer and so treatment is recommended to prevent development of skin cancer.  Recommend daily broad spectrum sunscreen SPF 30+ to sun-exposed areas, reapply every 2 hours as needed.  Recommend staying in the shade or wearing long sleeves, sun glasses (UVA+UVB protection) and wide brim hats (4-inch brim around the entire circumference of the hat). Call for new or changing lesions. - Destruction of lesion - Left Malar Cheek x1  Destruction method: cryotherapy   Informed consent: discussed and consent obtained   Lesion destroyed using liquid nitrogen: Yes   Region frozen until ice ball extended  beyond lesion: Yes   Outcome: patient tolerated procedure well with no complications   Post-procedure details: wound care instructions given   Additional details:  Prior to procedure, discussed risks of blister formation, small wound, skin dyspigmentation, or rare scar following cryotherapy. Recommend Vaseline ointment to treated areas while healing.   Skin cancer screening performed today.  HISTORY OF BASAL CELL CARCINOMA OF THE SKIN. Right medial alar crease, 01/09/2018, [facility]. Left mid lat neck, 11/25/2018. Left temple, 05/16/2023. - No evidence of recurrence today - Recommend regular full body skin exams - Recommend daily broad spectrum sunscreen SPF 30+ to sun-exposed areas, reapply every 2 hours as needed.  - Call if any new or changing lesions are noted between office visits - pt is concerned about lumpy scar on R side of nose from [facility] excision- will refer to [facility]. Lum Gaskins at [facility] to evaluate for possible dermabrasion   Actinic Damage - Chronic condition, secondary to cumulative UV/sun exposure - diffuse scaly erythematous macules with underlying dyspigmentation - Recommend daily broad spectrum sunscreen SPF 30+ to sun-exposed areas, reapply every 2 hours as needed.  - Staying in the shade or wearing long sleeves, sun glasses (UVA+UVB protection) and wide brim hats (4-inch brim around the entire circumference of the hat) are also recommended for sun protection.  - Call for new or changing lesions.  Lentigines, Seborrheic Keratoses, Hemangiomas - Benign normal skin lesions - Benign-appearing - Call for any changes  Melanocytic Nevi - Tan-brown and/or pink-flesh-colored symmetric macules and papules - 3 mm speckled brown macule at L spinal mid upper back - Benign  appearing on exam today - Observation - Call clinic for new or changing moles - Recommend daily use of broad spectrum spf 30+ sunscreen to sun-exposed areas.   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  Treatment Plan: A dermatofibroma is a benign growth possibly related to trauma, such as an insect bite, cut from shaving, or inflamed acne-type bump.  Treatment options to remove include shave or excision with resulting scar and risk of recurrence.  Since benign-appearing and not bothersome, will observe for now.   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 Condition is symptomatic/ bothersome to patient. Not currently at goal.    Rosacea is a chronic progressive skin condition usually affecting the face of adults, causing redness and/or acne bumps. It is treatable but not curable. It sometimes affects the eyes (ocular rosacea) as well. It may respond to topical and/or systemic medication and can flare with stress, sun exposure, alcohol, exercise, topical steroids (including hydrocortisone/cortisone 10) and some foods.  Daily application of broad spectrum spf 30+ sunscreen to face is recommended to reduce flares.  Patient denies grittiness of the eyes  Treatment Plan  Start Metronidazole  0.75% gel to face at bedtime for rosacea.   Counseling for BBL / IPL / Laser and Coordination of Care Discussed the treatment option of Broad Band Light (BBL) /Intense Pulsed Light (IPL)/ Laser for skin discoloration, including brown spots and redness.  Typically we recommend at least 1-3 treatment sessions about 5-8 weeks apart for best results.  Cannot have tanned skin when BBL performed, and regular use of sunscreen/photoprotection is advised after the procedure to help maintain results. The patient's condition may also require maintenance treatments in the future.  The fee for BBL / laser  treatments is $350 per treatment session for the whole face.  A fee can be quoted for other parts of the body.  Insurance typically does not pay for BBL/laser treatments and therefore the fee is an out-of-pocket cost. Recommend prophylactic valtrex treatment. Once scheduled for procedure, will send Rx in prior to patient's appointment.    Return in about 1 year (around 04/08/2025) for UBSE, HxBCC.  I, Jill Parcell, CMA, am acting as scribe for Rexene Rattler, MD.   Documentation: I have reviewed the above documentation for accuracy and completeness, and I agree with the above.  Rexene Rattler, MD

## 2024-04-08 NOTE — Patient Instructions (Addendum)
 Cryotherapy Aftercare  Wash gently with soap and water everyday.   Apply Vaseline Jelly daily until healed.    Start Metronidazole  0.75% gel to face at bedtime for rosacea.     Brett Lum Gaskins, MD Address: [address], [place], [place] [address] Phone: [phone] For micro dermabrasion for scar at nose.    Recommend daily broad spectrum sunscreen SPF 30+ to sun-exposed areas, reapply every 2 hours as needed. Call for new or changing lesions.  Staying in the shade or wearing long sleeves, sun glasses (UVA+UVB protection) and wide brim hats (4-inch brim around the entire circumference of the hat) are also recommended for sun protection.      Melanoma ABCDEs  Melanoma is the most dangerous type of skin cancer, and is the leading cause of death from skin disease.  You are more likely to develop melanoma if you: Have light-colored skin, light-colored eyes, or red or blond hair Spend a lot of time in the sun Tan regularly, either outdoors or in a tanning bed Have had blistering sunburns, especially during childhood Have a close family member who has had a melanoma Have atypical moles or large birthmarks  Early detection of melanoma is key since treatment is typically straightforward and cure rates are extremely high if we catch it early.   The first sign of melanoma is often a change in a mole or a new dark spot.  The ABCDE system is a way of remembering the signs of melanoma.  A for asymmetry:  The two halves do not match. B for border:  The edges of the growth are irregular. C for color:  A mixture of colors are present instead of an even brown color. D for diameter:  Melanomas are usually (but not always) greater than 6mm - the size of a pencil eraser. E for evolution:  The spot keeps changing in size, shape, and color.  Please check your skin once per month between visits. You can use a small mirror in front and a large mirror behind you to keep an eye on the  back side or your body.   If you see any new or changing lesions before your next follow-up, please call to schedule a visit.  Please continue daily skin protection including broad spectrum sunscreen SPF 30+ to sun-exposed areas, reapplying every 2 hours as needed when you're outdoors.   Staying in the shade or wearing long sleeves, sun glasses (UVA+UVB protection) and wide brim hats (4-inch brim around the entire circumference of the hat) are also recommended for sun protection.     Gentle Skin Care Guide  1. Bathe no more than once a day.  2. Avoid bathing in hot water  3. Use a mild soap like Dove, Vanicream, Cetaphil, CeraVe. Can use Lever 2000 or Cetaphil antibacterial soap  4. Use soap only where you need it. On most days, use it under your arms, between your legs, and on your feet. Let the water rinse other areas unless visibly dirty.  5. When you get out of the bath/shower, use a towel to gently blot your skin dry, don't rub it.  6. While your skin is still a little damp, apply a moisturizing cream such as Vanicream, CeraVe, Cetaphil, Eucerin, Sarna lotion or plain Vaseline Jelly. For hands apply Neutrogena Norwegian Hand Cream at bedtime.  7. Reapply moisturizer any time you start to itch or feel dry.  8. Sometimes using free and clear laundry detergents can be helpful. Fabric  softener sheets should be avoided. Downy Free & Gentle liquid, or any liquid fabric softener that is free of dyes and perfumes, it acceptable to use  9. If your doctor has given you prescription creams you may apply moisturizers over them   Hands: Recommend starting moisturizer with exfoliant (Urea, Salicylic acid, or Lactic acid) one to two times daily to help smooth rough and bumpy skin.  OTC options include Cetaphil Rough and Bumpy lotion (Urea), Eucerin Roughness Relief lotion or spot treatment cream (Urea), CeraVe SA lotion/cream for Rough and Bumpy skin (Sal Acid), Gold Bond Rough and Bumpy cream  (Sal Acid), and AmLactin 12% lotion/cream (Lactic Acid).  If applying in morning, also apply sunscreen to sun-exposed areas, since these exfoliating moisturizers can increase sensitivity to sun.     Due to recent changes in healthcare laws, you may see results of your pathology and/or laboratory studies on MyChart before the doctors have had a chance to review them. We understand that in some cases there may be results that are confusing or concerning to you. Please understand that not all results are received at the same time and often the doctors may need to interpret multiple results in order to provide you with the best plan of care or course of treatment. Therefore, we ask that you please give us  2 business days to thoroughly review all your results before contacting the office for clarification. Should we see a critical lab result, you will be contacted sooner.   If You Need Anything After Your Visit  If you have any questions or concerns for your doctor, please call our main line at [phone] and press option 4 to reach your doctor's medical assistant. If no one answers, please leave a voicemail as directed and we will return your call as soon as possible. Messages left after 4 pm will be answered the following business day.   You may also send us  a message via MyChart. We typically respond to MyChart messages within 1-2 business days.  For prescription refills, please ask your pharmacy to contact our office. Our fax number is [phone].  If you have an urgent issue when the clinic is closed that cannot wait until the next business day, you can page your doctor at the number below.    Please note that while we do our best to be available for urgent issues outside of office hours, we are not available 24/7.   If you have an urgent issue and are unable to reach us , you may choose to seek medical care at your doctor's office, retail clinic, urgent care center, or emergency room.  If  you have a medical emergency, please immediately call 911 or go to the emergency department.  Pager Numbers  - Dr. Hester: [phone]  - Dr. Jackquline: [phone]  - Dr. Claudene: [phone]   - Dr. Raymund: [phone]  In the event of inclement weather, please call our main line at [phone] for an update on the status of any delays or closures.  Dermatology Medication Tips: Please keep the boxes that topical medications come in in order to help keep track of the instructions about where and how to use these. Pharmacies typically print the medication instructions only on the boxes and not directly on the medication tubes.   If your medication is too expensive, please contact our office at [phone] option 4 or send us  a message through MyChart.   We are unable to tell what your co-pay for medications will be in  advance as this is different depending on your insurance coverage. However, we may be able to find a substitute medication at lower cost or fill out paperwork to get insurance to cover a needed medication.   If a prior authorization is required to get your medication covered by your insurance company, please allow us  1-2 business days to complete this process.  Drug prices often vary depending on where the prescription is filled and some pharmacies may offer cheaper prices.  The website www.goodrx.com contains coupons for medications through different pharmacies. The prices here do not account for what the cost may be with help from insurance (it may be cheaper with your insurance), but the website can give you the price if you did not use any insurance.  - You can print the associated coupon and take it with your prescription to the pharmacy.  - You may also stop by our office during regular business hours and pick up a GoodRx coupon card.  - If you need your prescription sent electronically to a different pharmacy, notify our office through [facility] or by  phone at [phone] option 4.     Si Usted Necesita Algo Despus de Su Visita  Tambin puede enviarnos un mensaje a travs de Clinical Cytogeneticist. Por lo general respondemos a los mensajes de MyChart en el transcurso de 1 a 2 das hbiles.  Para renovar recetas, por favor pida a su farmacia que se ponga en contacto con nuestra oficina. Randi lakes de fax es [place] [phone].  Si tiene un asunto urgente cuando la clnica est cerrada y que no puede esperar hasta el siguiente da hbil, puede llamar/localizar a su doctor(a) al nmero que aparece a continuacin.   Por favor, tenga en cuenta que aunque hacemos todo lo posible para estar disponibles para asuntos urgentes fuera del horario de [place], no estamos disponibles las 24 horas del da, los 7 [address]  [address] de la [place].   Si tiene un problema urgente y no puede comunicarse con nosotros, puede optar por buscar atencin mdica  en el consultorio de su doctor(a), en una clnica privada, en un centro de atencin urgente o en una sala de emergencias.  Si tiene engineer, drilling, por favor llame inmediatamente al 911 o vaya a la sala de emergencias.  Nmeros de bper  - Dr. Hester: [phone]  - Dra. Jackquline: [phone]  - Dr. Claudene: [phone]  - Dra. Kitts: [phone]  En caso de inclemencias del [place], por favor llame a nuestra lnea principal al [phone] para una actualizacin sobre el estado de cualquier retraso o cierre.  Consejos para la medicacin en dermatologa: Por favor, guarde las cajas en las que vienen los medicamentos de uso tpico para ayudarle a seguir las instrucciones sobre dnde y cmo usarlos. Las farmacias generalmente imprimen las instrucciones del medicamento slo en las cajas y no directamente en los tubos del [place].   Si su medicamento es muy caro, por favor, pngase en contacto con landry rieger llamando al [phone] y presione la opcin 4 o envenos un mensaje a travs de Clinical Cytogeneticist.   No  podemos decirle cul ser su copago por los medicamentos por adelantado ya que esto es diferente dependiendo de la cobertura de su seguro. Sin embargo, es posible que podamos encontrar un medicamento sustituto a audiological scientist un formulario para que el seguro cubra el medicamento que se considera necesario.   Si se requiere una autorizacin previa para que su compaa de seguros cubra su medicamento, por favor  permtanos de 1 a 2 das hbiles para completar [address]  Los precios de los medicamentos varan con frecuencia dependiendo del environmental consultant de dnde se surte la receta y alguna farmacias pueden ofrecer precios ms baratos.  El sitio web www.goodrx.com tiene cupones para medicamentos de health and safety inspector. Los precios aqu no tienen en cuenta lo que podra costar con la ayuda del seguro (puede ser ms barato con su seguro), pero el sitio web puede darle el precio si no utiliz tourist information centre manager.  - Puede imprimir el cupn correspondiente y llevarlo con su receta a la farmacia.  - Tambin puede pasar por nuestra oficina durante el horario de atencin regular y education officer, museum una tarjeta de cupones de GoodRx.  - Si necesita que su receta se enve electrnicamente a una farmacia diferente, informe a nuestra oficina a travs de MyChart de [place] o por telfono llamando al [phone] y presione la opcin 4.

## 2024-05-13 ENCOUNTER — Ambulatory Visit: Admitting: Dermatology

## 2025-04-07 ENCOUNTER — Ambulatory Visit: Admitting: Dermatology
